# Patient Record
Sex: Male | Born: 1950 | Race: Black or African American | Hispanic: No | Marital: Single | Smoking: Never smoker
Health system: Southern US, Community
[De-identification: ages and names within clinical notes are randomized; demographics above are authoritative.]

## PROBLEM LIST (undated history)

## (undated) DIAGNOSIS — I2510 Atherosclerotic heart disease of native coronary artery without angina pectoris: Secondary | ICD-10-CM

## (undated) DIAGNOSIS — I10 Essential (primary) hypertension: Secondary | ICD-10-CM

## (undated) DIAGNOSIS — G4733 Obstructive sleep apnea (adult) (pediatric): Secondary | ICD-10-CM

## (undated) DIAGNOSIS — E785 Hyperlipidemia, unspecified: Secondary | ICD-10-CM

## (undated) DIAGNOSIS — E669 Obesity, unspecified: Secondary | ICD-10-CM

## (undated) DIAGNOSIS — E1169 Type 2 diabetes mellitus with other specified complication: Secondary | ICD-10-CM

## (undated) DIAGNOSIS — N189 Chronic kidney disease, unspecified: Secondary | ICD-10-CM

## (undated) DIAGNOSIS — I509 Heart failure, unspecified: Secondary | ICD-10-CM

## (undated) HISTORY — DX: Heart failure, unspecified: I50.9

## (undated) HISTORY — DX: Chronic kidney disease, unspecified: N18.9

## (undated) HISTORY — DX: Obesity, unspecified: E66.9

## (undated) HISTORY — DX: Hyperlipidemia, unspecified: E78.5

## (undated) HISTORY — DX: Type 2 diabetes mellitus with other specified complication: E11.69

## (undated) HISTORY — DX: Atherosclerotic heart disease of native coronary artery without angina pectoris: I25.10

## (undated) HISTORY — DX: Morbid (severe) obesity due to excess calories: E66.01

## (undated) HISTORY — DX: Essential (primary) hypertension: I10

## (undated) HISTORY — DX: Obstructive sleep apnea (adult) (pediatric): G47.33

---

## 2006-08-04 DIAGNOSIS — I2510 Atherosclerotic heart disease of native coronary artery without angina pectoris: Secondary | ICD-10-CM

## 2006-08-04 HISTORY — DX: Atherosclerotic heart disease of native coronary artery without angina pectoris: I25.10

## 2007-01-25 ENCOUNTER — Ambulatory Visit: Payer: Self-pay | Admitting: Cardiology

## 2007-01-26 ENCOUNTER — Ambulatory Visit: Payer: Self-pay | Admitting: Cardiology

## 2007-01-26 ENCOUNTER — Inpatient Hospital Stay (HOSPITAL_COMMUNITY): Admission: AD | Admit: 2007-01-26 | Discharge: 2007-01-30 | Payer: Self-pay | Admitting: Internal Medicine

## 2007-02-02 ENCOUNTER — Ambulatory Visit: Payer: Self-pay | Admitting: Cardiology

## 2007-02-02 LAB — CONVERTED CEMR LAB
BUN: 24 mg/dL — ABNORMAL HIGH (ref 6–23)
Calcium: 9.4 mg/dL (ref 8.4–10.5)
GFR calc Af Amer: 51 mL/min
GFR calc non Af Amer: 42 mL/min

## 2007-03-01 ENCOUNTER — Ambulatory Visit: Payer: Self-pay | Admitting: Cardiology

## 2007-03-01 ENCOUNTER — Ambulatory Visit: Payer: Self-pay

## 2007-03-01 LAB — CONVERTED CEMR LAB
BUN: 15 mg/dL (ref 6–23)
Calcium: 9.2 mg/dL (ref 8.4–10.5)
GFR calc Af Amer: 54 mL/min
GFR calc non Af Amer: 45 mL/min
Glucose, Bld: 64 mg/dL — ABNORMAL LOW (ref 70–99)
Potassium: 3.9 meq/L (ref 3.5–5.1)

## 2007-03-04 ENCOUNTER — Ambulatory Visit: Payer: Self-pay | Admitting: Cardiology

## 2007-03-04 ENCOUNTER — Inpatient Hospital Stay (HOSPITAL_COMMUNITY): Admission: AD | Admit: 2007-03-04 | Discharge: 2007-03-08 | Payer: Self-pay | Admitting: Internal Medicine

## 2007-03-09 ENCOUNTER — Ambulatory Visit: Payer: Self-pay | Admitting: Internal Medicine

## 2007-03-09 LAB — CONVERTED CEMR LAB
Calcium: 9.6 mg/dL (ref 8.4–10.5)
Chloride: 102 meq/L (ref 96–112)
GFR calc Af Amer: 54 mL/min
GFR calc non Af Amer: 45 mL/min
Sodium: 139 meq/L (ref 135–145)

## 2007-03-17 ENCOUNTER — Ambulatory Visit: Payer: Self-pay | Admitting: Internal Medicine

## 2007-03-17 LAB — CONVERTED CEMR LAB
BUN: 20 mg/dL (ref 6–23)
Creatinine, Ser: 1.8 mg/dL — ABNORMAL HIGH (ref 0.4–1.5)
GFR calc Af Amer: 51 mL/min
GFR calc non Af Amer: 42 mL/min
Potassium: 3.5 meq/L (ref 3.5–5.1)
Pro B Natriuretic peptide (BNP): 22 pg/mL (ref 0.0–100.0)

## 2007-04-12 ENCOUNTER — Ambulatory Visit: Payer: Self-pay | Admitting: Cardiology

## 2007-05-25 ENCOUNTER — Ambulatory Visit: Payer: Self-pay | Admitting: Internal Medicine

## 2007-05-25 LAB — CONVERTED CEMR LAB
ALT: 10 units/L (ref 0–53)
AST: 15 units/L (ref 0–37)
Alkaline Phosphatase: 73 units/L (ref 39–117)
BUN: 16 mg/dL (ref 6–23)
Bilirubin, Direct: 0.1 mg/dL (ref 0.0–0.3)
CO2: 31 meq/L (ref 19–32)
Calcium: 9.5 mg/dL (ref 8.4–10.5)
Chloride: 104 meq/L (ref 96–112)
Cholesterol: 133 mg/dL (ref 0–200)
Creatinine, Ser: 1.8 mg/dL — ABNORMAL HIGH (ref 0.4–1.5)
Glucose, Bld: 119 mg/dL — ABNORMAL HIGH (ref 70–99)
Total Bilirubin: 0.6 mg/dL (ref 0.3–1.2)
Total CHOL/HDL Ratio: 3.7
Total Protein: 7.5 g/dL (ref 6.0–8.3)

## 2007-06-08 ENCOUNTER — Ambulatory Visit (HOSPITAL_COMMUNITY): Admission: RE | Admit: 2007-06-08 | Discharge: 2007-06-08 | Payer: Self-pay | Admitting: Cardiology

## 2007-06-21 ENCOUNTER — Ambulatory Visit: Payer: Self-pay | Admitting: Cardiology

## 2007-06-22 ENCOUNTER — Ambulatory Visit: Payer: Self-pay | Admitting: Internal Medicine

## 2007-07-22 ENCOUNTER — Ambulatory Visit: Payer: Self-pay | Admitting: Cardiology

## 2007-07-22 LAB — CONVERTED CEMR LAB
ALT: 14 units/L (ref 0–53)
AST: 15 units/L (ref 0–37)
Albumin: 3.8 g/dL (ref 3.5–5.2)
Basophils Absolute: 0.1 10*3/uL (ref 0.0–0.1)
Calcium: 9.7 mg/dL (ref 8.4–10.5)
Chloride: 100 meq/L (ref 96–112)
Creatinine, Ser: 1.9 mg/dL — ABNORMAL HIGH (ref 0.4–1.5)
Eosinophils Absolute: 0.2 10*3/uL (ref 0.0–0.6)
Eosinophils Relative: 2.1 % (ref 0.0–5.0)
GFR calc non Af Amer: 39 mL/min
Glucose, Bld: 91 mg/dL (ref 70–99)
HCT: 35.5 % — ABNORMAL LOW (ref 39.0–52.0)
LDL Cholesterol: 63 mg/dL (ref 0–99)
MCV: 85.3 fL (ref 78.0–100.0)
Neutrophils Relative %: 70.9 % (ref 43.0–77.0)
Platelets: 176 10*3/uL (ref 150–400)
RBC: 4.17 M/uL — ABNORMAL LOW (ref 4.22–5.81)
RDW: 14.6 % (ref 11.5–14.6)
Sodium: 138 meq/L (ref 135–145)
Total Bilirubin: 0.5 mg/dL (ref 0.3–1.2)
Total CHOL/HDL Ratio: 3.2
Triglycerides: 95 mg/dL (ref 0–149)
WBC: 9 10*3/uL (ref 4.5–10.5)

## 2007-08-19 ENCOUNTER — Ambulatory Visit: Payer: Self-pay | Admitting: Internal Medicine

## 2007-09-22 ENCOUNTER — Ambulatory Visit: Payer: Self-pay | Admitting: Internal Medicine

## 2007-09-22 LAB — CONVERTED CEMR LAB
BUN: 18 mg/dL (ref 6–23)
CO2: 32 meq/L (ref 19–32)
Calcium: 9.6 mg/dL (ref 8.4–10.5)
Creatinine, Ser: 1.8 mg/dL — ABNORMAL HIGH (ref 0.4–1.5)
Glucose, Bld: 140 mg/dL — ABNORMAL HIGH (ref 70–99)
Pro B Natriuretic peptide (BNP): 15 pg/mL (ref 0.0–100.0)

## 2007-10-01 ENCOUNTER — Ambulatory Visit: Payer: Self-pay | Admitting: Cardiology

## 2007-10-01 LAB — CONVERTED CEMR LAB
BUN: 17 mg/dL (ref 6–23)
CO2: 33 meq/L — ABNORMAL HIGH (ref 19–32)
Calcium: 9.5 mg/dL (ref 8.4–10.5)
Chloride: 104 meq/L (ref 96–112)
Creatinine, Ser: 1.4 mg/dL (ref 0.4–1.5)
Potassium: 3.8 meq/L (ref 3.5–5.1)

## 2007-10-19 ENCOUNTER — Ambulatory Visit: Payer: Self-pay | Admitting: Cardiology

## 2007-11-01 ENCOUNTER — Encounter: Payer: Self-pay | Admitting: Cardiology

## 2007-11-01 ENCOUNTER — Ambulatory Visit: Payer: Self-pay

## 2007-11-03 ENCOUNTER — Ambulatory Visit: Payer: Self-pay | Admitting: Internal Medicine

## 2008-02-02 ENCOUNTER — Ambulatory Visit: Payer: Self-pay | Admitting: Internal Medicine

## 2008-02-02 LAB — CONVERTED CEMR LAB
BUN: 19 mg/dL (ref 6–23)
CO2: 30 meq/L (ref 19–32)
Chloride: 107 meq/L (ref 96–112)
GFR calc Af Amer: 54 mL/min
Glucose, Bld: 144 mg/dL — ABNORMAL HIGH (ref 70–99)
Potassium: 3.6 meq/L (ref 3.5–5.1)
Sodium: 143 meq/L (ref 135–145)

## 2008-03-13 ENCOUNTER — Ambulatory Visit: Payer: Self-pay | Admitting: Cardiology

## 2008-03-13 LAB — CONVERTED CEMR LAB
Chloride: 106 meq/L (ref 96–112)
GFR calc Af Amer: 51 mL/min
Glucose, Bld: 129 mg/dL — ABNORMAL HIGH (ref 70–99)
Potassium: 4 meq/L (ref 3.5–5.1)
Sodium: 142 meq/L (ref 135–145)

## 2008-03-27 ENCOUNTER — Ambulatory Visit: Payer: Self-pay | Admitting: Internal Medicine

## 2008-04-20 ENCOUNTER — Ambulatory Visit: Payer: Self-pay | Admitting: Internal Medicine

## 2008-04-26 ENCOUNTER — Ambulatory Visit: Payer: Self-pay | Admitting: Internal Medicine

## 2008-04-26 ENCOUNTER — Ambulatory Visit: Payer: Self-pay | Admitting: Cardiology

## 2008-04-26 LAB — CONVERTED CEMR LAB
Chloride: 106 meq/L (ref 96–112)
GFR calc Af Amer: 51 mL/min
GFR calc non Af Amer: 42 mL/min
Glucose, Bld: 154 mg/dL — ABNORMAL HIGH (ref 70–99)
Potassium: 4.2 meq/L (ref 3.5–5.1)
Pro B Natriuretic peptide (BNP): 19 pg/mL (ref 0.0–100.0)
Sodium: 140 meq/L (ref 135–145)

## 2008-06-20 ENCOUNTER — Ambulatory Visit: Payer: Self-pay | Admitting: Internal Medicine

## 2008-06-20 LAB — CONVERTED CEMR LAB
CO2: 31 meq/L (ref 19–32)
Calcium: 9.3 mg/dL (ref 8.4–10.5)
GFR calc Af Amer: 54 mL/min
Potassium: 3.7 meq/L (ref 3.5–5.1)
Pro B Natriuretic peptide (BNP): 26 pg/mL (ref 0.0–100.0)
Sodium: 139 meq/L (ref 135–145)

## 2008-08-28 ENCOUNTER — Ambulatory Visit: Payer: Self-pay | Admitting: Internal Medicine

## 2008-08-28 LAB — CONVERTED CEMR LAB
AST: 17 units/L (ref 0–37)
Albumin: 3.7 g/dL (ref 3.5–5.2)
Alkaline Phosphatase: 63 units/L (ref 39–117)
BUN: 35 mg/dL — ABNORMAL HIGH (ref 6–23)
CO2: 34 meq/L — ABNORMAL HIGH (ref 19–32)
Chloride: 101 meq/L (ref 96–112)
Eosinophils Relative: 1.5 % (ref 0.0–5.0)
Glucose, Bld: 185 mg/dL — ABNORMAL HIGH (ref 70–99)
Monocytes Relative: 6.3 % (ref 3.0–12.0)
Neutrophils Relative %: 71.8 % (ref 43.0–77.0)
Platelets: 182 10*3/uL (ref 150–400)
Potassium: 2.9 meq/L — ABNORMAL LOW (ref 3.5–5.1)
Total Protein: 7.7 g/dL (ref 6.0–8.3)
WBC: 11.2 10*3/uL — ABNORMAL HIGH (ref 4.5–10.5)

## 2008-08-30 ENCOUNTER — Ambulatory Visit: Payer: Self-pay | Admitting: Internal Medicine

## 2008-08-30 ENCOUNTER — Ambulatory Visit: Payer: Self-pay

## 2008-08-30 LAB — CONVERTED CEMR LAB
BUN: 37 mg/dL — ABNORMAL HIGH (ref 6–23)
Chloride: 100 meq/L (ref 96–112)
Creatinine, Ser: 2.1 mg/dL — ABNORMAL HIGH (ref 0.4–1.5)
GFR calc non Af Amer: 35 mL/min

## 2008-09-04 ENCOUNTER — Ambulatory Visit: Payer: Self-pay | Admitting: Internal Medicine

## 2008-09-04 LAB — CONVERTED CEMR LAB
CO2: 33 meq/L — ABNORMAL HIGH (ref 19–32)
Calcium: 9.8 mg/dL (ref 8.4–10.5)
Creatinine, Ser: 1.9 mg/dL — ABNORMAL HIGH (ref 0.4–1.5)

## 2008-11-22 IMAGING — CT CT ANGIO ABDOMEN
2 of 8 series · 15 of 46 positions shown, 17 images · IV contrast (omnipaque)
Comparison: none

CLINICAL DATA: 56-year old male hypertensive crisis, uncontrolled hypertension.
CT ANGIOGRAPHY OF ABDOMEN:
TECHNIQUE: Multidetector CT imaging of the abdomen was performed during bolus injection of intravenous contrast.  Multiplanar CT angiographic image reconstructions were generated to evaluate the vascular anatomy.
Contrast:  100 cc Omnipaque 350.

[Series 5: abd cta 2.0 (id) · axial · 0.88mm/px · z∈[-300,-16]mm · 12 of 168 slices shown, 14 images]
[im 13/168  soft-tissue]
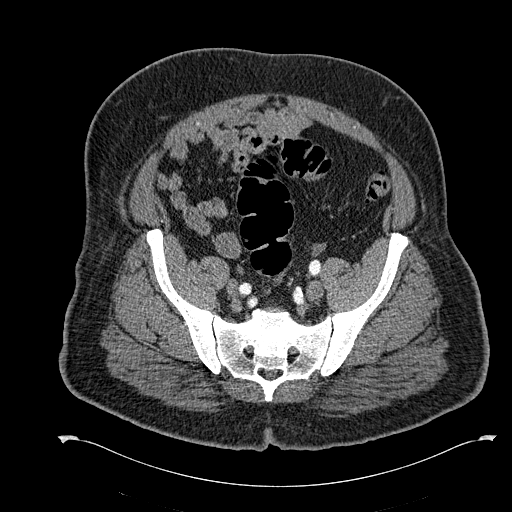
[im 13/168  bone]
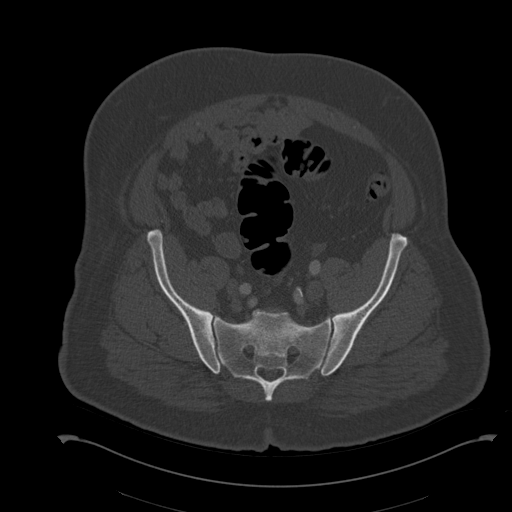
[im 26/168  soft-tissue]
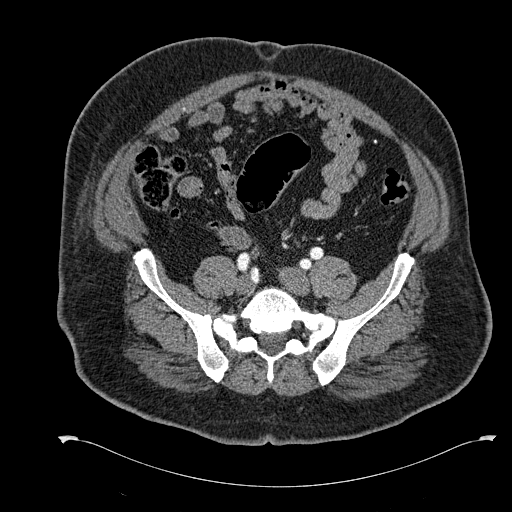
[im 39/168  soft-tissue]
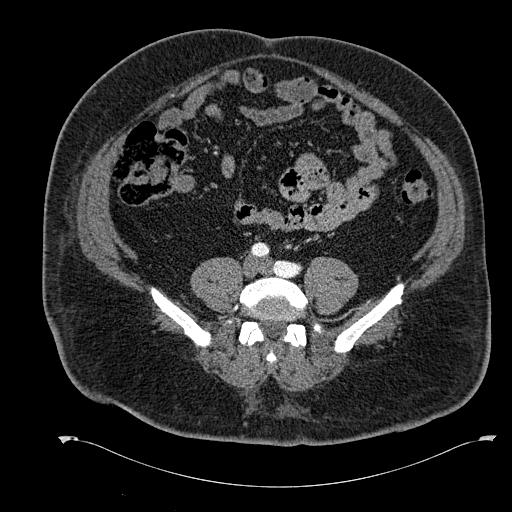
[im 52/168  soft-tissue]
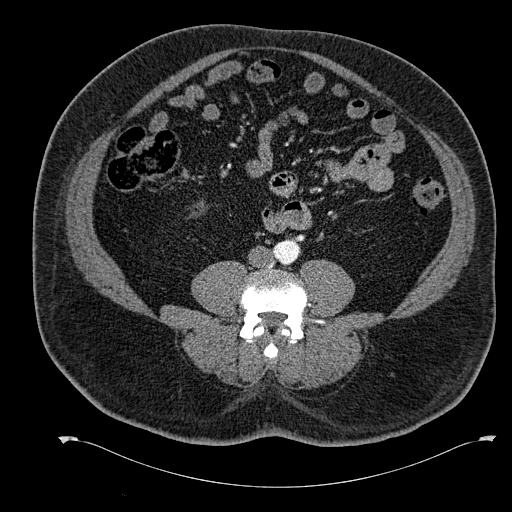
[im 65/168  soft-tissue]
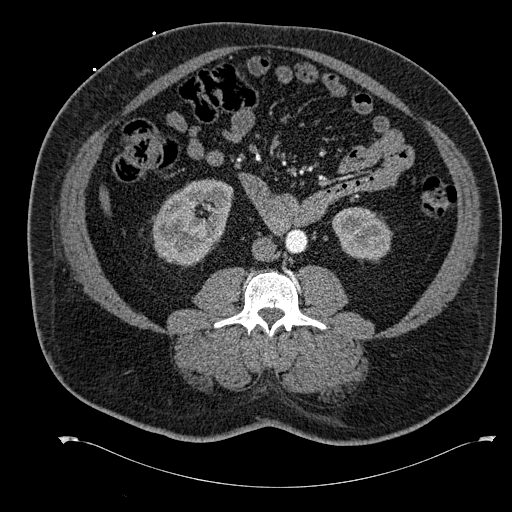
[im 78/168  soft-tissue]
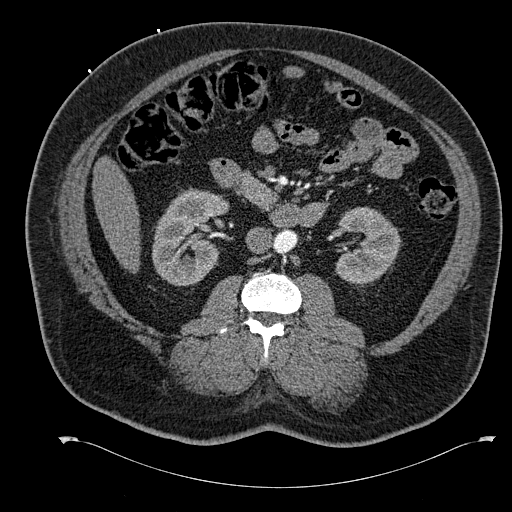
[im 90/168  soft-tissue]
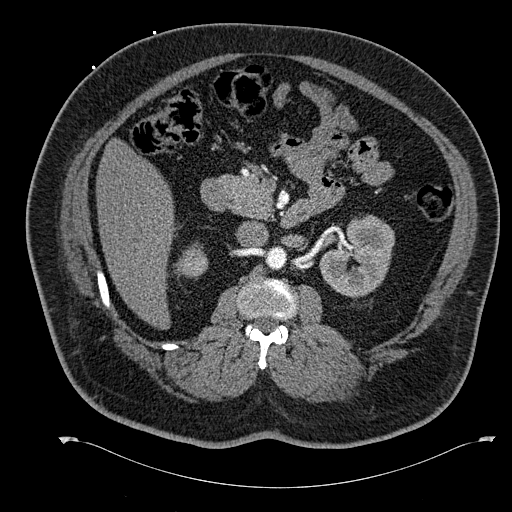
[im 103/168  soft-tissue]
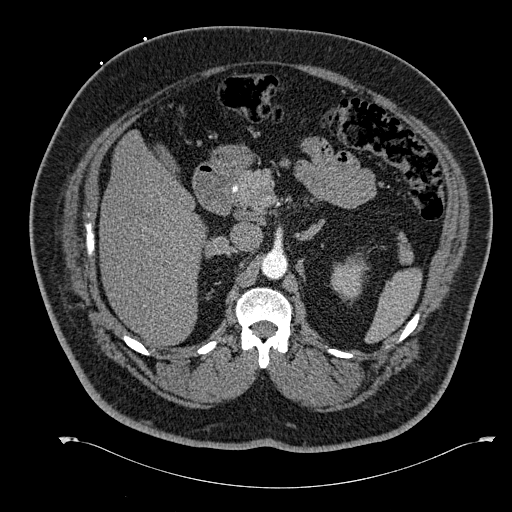
[im 116/168  soft-tissue]
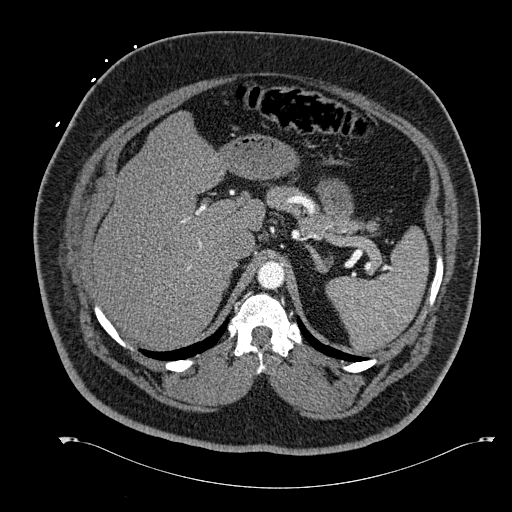
[im 116/168  bone]
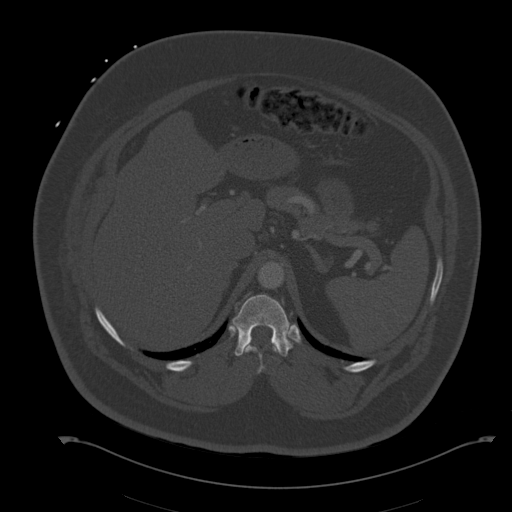
[im 129/168  soft-tissue]
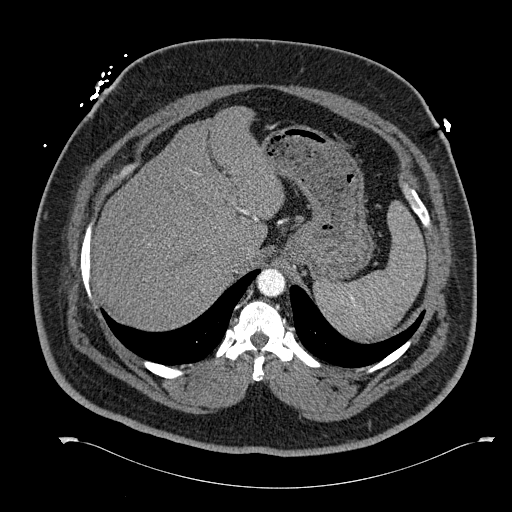
[im 142/168  soft-tissue]
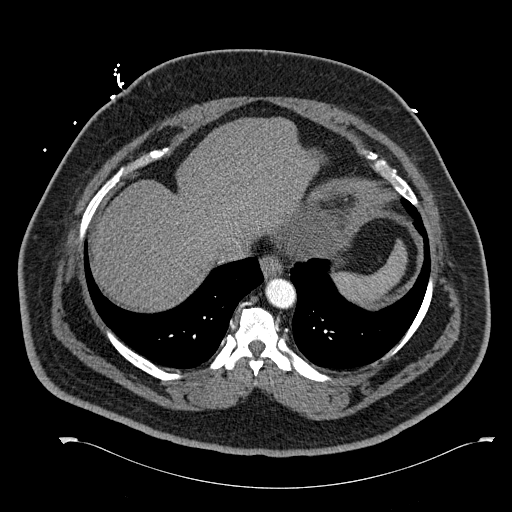
[im 155/168  soft-tissue]
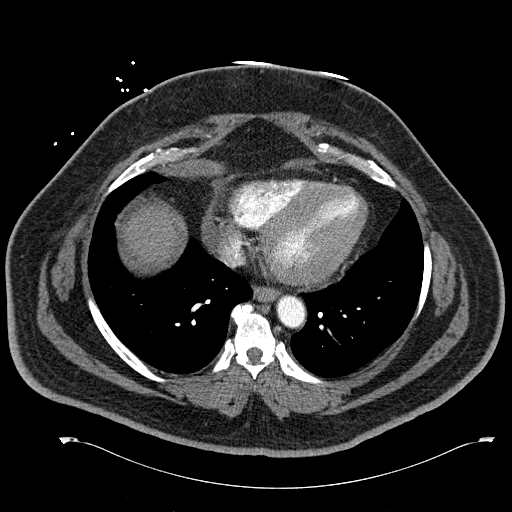

[Series 602: cor · coronal · 0.74mm/px · 3 of 168 slices shown]
[im 56/168  soft-tissue]
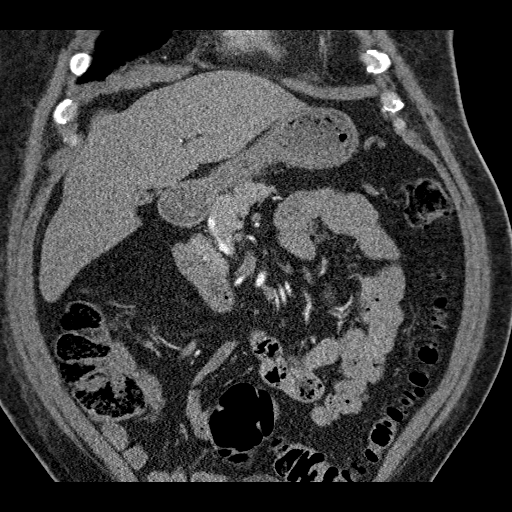
[im 75/168  soft-tissue]
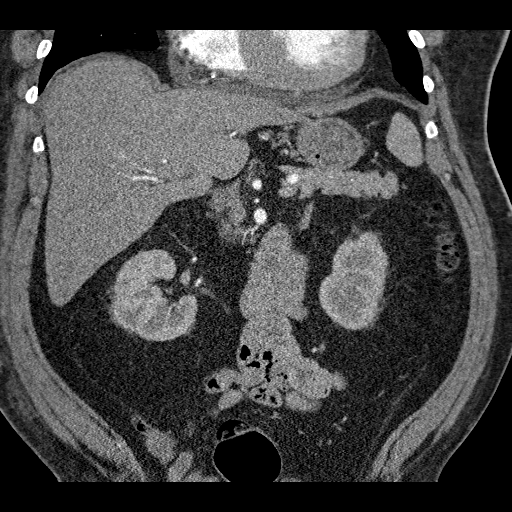
[im 93/168  soft-tissue]
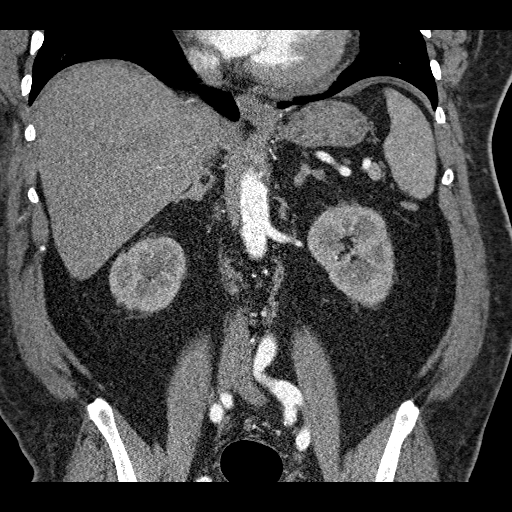

[15 of 46 positions shown; findings below may reference images not displayed]

FINDINGS: Minimal atherosclerosis and calcification of the abdominal aorta without aneurysm, dissection or occlusive disease.  The celiac, superior mesenteric, single renal arteries and inferior mesenteric arteries are all patent.  No evidence of proximal or ostial renal artery narrowing or stenosis.  No accessory renal vasculature.  Of note, the left hepatic artery originates off the left gastric artery and the right hepatic artery originates off the SMA proximally, these are normal variants of the abdominal vascular anatomy.  There is a small dependent pericardial effusion.  Kidneys are symmetric in size and shape.  No evidence of cortical atrophy or thinning.  No hydronephrosis or acute obstructive uropathy.
Additional imaging of the abdomen demonstrates no acute inflammation, ascites, fluid collection, abscess or obstruction.  The liver, collapsed gallbladder, biliary system, spleen, pancreas and adrenal glands are within normal limits.  Exam of the bowel is limited without oral contrast for CTA protocol.  No visualized bowel wall thickening.  Terminal ileum and appendix are normal in the right lower quadrant.  Degenerative changes are present in the thoracic spine.
IMPRESSION: 1.  Patent single renal arteries.  No evidence of proximal or ostial renal vascular disease. 
2.  Mild aortic atherosclerosis without aneurysm or occlusive disease. 
3.  Patent visceral arteries as described.

## 2008-12-15 ENCOUNTER — Telehealth (INDEPENDENT_AMBULATORY_CARE_PROVIDER_SITE_OTHER): Payer: Self-pay | Admitting: Nurse Practitioner

## 2008-12-26 ENCOUNTER — Ambulatory Visit: Payer: Self-pay | Admitting: Internal Medicine

## 2008-12-26 ENCOUNTER — Encounter (INDEPENDENT_AMBULATORY_CARE_PROVIDER_SITE_OTHER): Payer: Self-pay | Admitting: Nurse Practitioner

## 2008-12-26 DIAGNOSIS — I509 Heart failure, unspecified: Secondary | ICD-10-CM | POA: Insufficient documentation

## 2008-12-26 DIAGNOSIS — I2510 Atherosclerotic heart disease of native coronary artery without angina pectoris: Secondary | ICD-10-CM | POA: Insufficient documentation

## 2008-12-26 DIAGNOSIS — E119 Type 2 diabetes mellitus without complications: Secondary | ICD-10-CM

## 2008-12-26 DIAGNOSIS — M109 Gout, unspecified: Secondary | ICD-10-CM

## 2008-12-26 DIAGNOSIS — I10 Essential (primary) hypertension: Secondary | ICD-10-CM | POA: Insufficient documentation

## 2008-12-26 DIAGNOSIS — N259 Disorder resulting from impaired renal tubular function, unspecified: Secondary | ICD-10-CM | POA: Insufficient documentation

## 2008-12-26 DIAGNOSIS — E785 Hyperlipidemia, unspecified: Secondary | ICD-10-CM | POA: Insufficient documentation

## 2008-12-28 ENCOUNTER — Telehealth (INDEPENDENT_AMBULATORY_CARE_PROVIDER_SITE_OTHER): Payer: Self-pay | Admitting: Nurse Practitioner

## 2008-12-28 ENCOUNTER — Encounter (INDEPENDENT_AMBULATORY_CARE_PROVIDER_SITE_OTHER): Payer: Self-pay | Admitting: Nurse Practitioner

## 2008-12-28 LAB — CONVERTED CEMR LAB
ALT: 13 units/L (ref 0–53)
Albumin: 3.7 g/dL (ref 3.5–5.2)
BUN: 27 mg/dL — ABNORMAL HIGH (ref 6–23)
CO2: 33 meq/L — ABNORMAL HIGH (ref 19–32)
Calcium: 9.4 mg/dL (ref 8.4–10.5)
Cholesterol: 196 mg/dL (ref 0–200)
Creatinine, Ser: 1.9 mg/dL — ABNORMAL HIGH (ref 0.4–1.5)
Glucose, Bld: 166 mg/dL — ABNORMAL HIGH (ref 70–99)
Pro B Natriuretic peptide (BNP): 27 pg/mL (ref 0.0–100.0)
Total Bilirubin: 0.4 mg/dL (ref 0.3–1.2)
Total Protein: 7.3 g/dL (ref 6.0–8.3)
Triglycerides: 135 mg/dL (ref 0.0–149.0)

## 2009-01-02 ENCOUNTER — Ambulatory Visit: Payer: Self-pay | Admitting: Internal Medicine

## 2009-01-02 ENCOUNTER — Telehealth (INDEPENDENT_AMBULATORY_CARE_PROVIDER_SITE_OTHER): Payer: Self-pay | Admitting: Nurse Practitioner

## 2009-01-02 DIAGNOSIS — I5023 Acute on chronic systolic (congestive) heart failure: Secondary | ICD-10-CM | POA: Insufficient documentation

## 2009-01-02 LAB — CONVERTED CEMR LAB
CO2: 35 meq/L — ABNORMAL HIGH (ref 19–32)
Chloride: 104 meq/L (ref 96–112)
Glucose, Bld: 119 mg/dL — ABNORMAL HIGH (ref 70–99)
Potassium: 3.4 meq/L — ABNORMAL LOW (ref 3.5–5.1)
Sodium: 143 meq/L (ref 135–145)

## 2009-01-05 ENCOUNTER — Telehealth (INDEPENDENT_AMBULATORY_CARE_PROVIDER_SITE_OTHER): Payer: Self-pay | Admitting: Nurse Practitioner

## 2009-01-10 ENCOUNTER — Encounter: Payer: Self-pay | Admitting: Internal Medicine

## 2009-03-15 ENCOUNTER — Encounter: Payer: Self-pay | Admitting: Internal Medicine

## 2009-03-15 ENCOUNTER — Ambulatory Visit: Payer: Self-pay | Admitting: Internal Medicine

## 2009-03-20 ENCOUNTER — Telehealth (INDEPENDENT_AMBULATORY_CARE_PROVIDER_SITE_OTHER): Payer: Self-pay | Admitting: *Deleted

## 2009-03-21 ENCOUNTER — Ambulatory Visit: Payer: Self-pay

## 2009-03-21 ENCOUNTER — Ambulatory Visit: Payer: Self-pay | Admitting: Internal Medicine

## 2009-03-22 LAB — CONVERTED CEMR LAB
Calcium: 9.5 mg/dL (ref 8.4–10.5)
Creatinine, Ser: 2.1 mg/dL — ABNORMAL HIGH (ref 0.4–1.5)
GFR calc non Af Amer: 42.16 mL/min (ref >60)
HDL: 41.2 mg/dL (ref >39.00)
Sodium: 143 meq/L (ref 135–145)
Total CHOL/HDL Ratio: 3
Triglycerides: 88 mg/dL (ref 0.0–149.0)
VLDL: 17.6 mg/dL (ref 0.0–40.0)

## 2009-03-26 ENCOUNTER — Ambulatory Visit: Payer: Self-pay | Admitting: Internal Medicine

## 2009-03-26 DIAGNOSIS — I5022 Chronic systolic (congestive) heart failure: Secondary | ICD-10-CM

## 2009-03-30 LAB — CONVERTED CEMR LAB
Chloride: 103 meq/L (ref 96–112)
Creatinine, Ser: 1.6 mg/dL — ABNORMAL HIGH (ref 0.4–1.5)
GFR calc non Af Amer: 57.7 mL/min (ref >60)
Potassium: 4.3 meq/L (ref 3.5–5.1)
Pro B Natriuretic peptide (BNP): 32 pg/mL (ref 0.0–100.0)

## 2009-05-07 ENCOUNTER — Encounter (INDEPENDENT_AMBULATORY_CARE_PROVIDER_SITE_OTHER): Payer: Self-pay | Admitting: *Deleted

## 2009-05-15 ENCOUNTER — Encounter: Payer: Self-pay | Admitting: Internal Medicine

## 2009-05-17 ENCOUNTER — Encounter: Payer: Self-pay | Admitting: Internal Medicine

## 2009-05-31 ENCOUNTER — Ambulatory Visit: Payer: Self-pay | Admitting: Internal Medicine

## 2009-05-31 DIAGNOSIS — M79609 Pain in unspecified limb: Secondary | ICD-10-CM

## 2009-05-31 LAB — CONVERTED CEMR LAB: Cholesterol, target level: 200 mg/dL

## 2009-06-01 ENCOUNTER — Telehealth (INDEPENDENT_AMBULATORY_CARE_PROVIDER_SITE_OTHER): Payer: Self-pay | Admitting: *Deleted

## 2009-06-13 ENCOUNTER — Encounter: Payer: Self-pay | Admitting: Internal Medicine

## 2009-06-13 ENCOUNTER — Ambulatory Visit: Payer: Self-pay

## 2009-06-20 ENCOUNTER — Encounter: Payer: Self-pay | Admitting: Internal Medicine

## 2009-11-26 ENCOUNTER — Ambulatory Visit: Payer: Self-pay | Admitting: Internal Medicine

## 2009-11-26 DIAGNOSIS — I5032 Chronic diastolic (congestive) heart failure: Secondary | ICD-10-CM | POA: Insufficient documentation

## 2010-06-12 ENCOUNTER — Ambulatory Visit: Payer: Self-pay | Admitting: Internal Medicine

## 2010-06-13 LAB — CONVERTED CEMR LAB
BUN: 26 mg/dL — ABNORMAL HIGH (ref 6–23)
CO2: 26 meq/L (ref 19–32)
Calcium: 9.9 mg/dL (ref 8.4–10.5)
Chloride: 102 meq/L (ref 96–112)
Creatinine, Ser: 2.2 mg/dL — ABNORMAL HIGH (ref 0.4–1.5)
Pro B Natriuretic peptide (BNP): 12.4 pg/mL (ref 0.0–100.0)

## 2010-06-26 ENCOUNTER — Ambulatory Visit: Payer: Self-pay | Admitting: Internal Medicine

## 2010-09-04 NOTE — Assessment & Plan Note (Signed)
Summary: f60m   Visit Type:  Follow-up Referring Faris Coolman:  Nicholes Mango Primary Levonte Molina:  Dr Bertram Denver  CC:  no complaints.  History of Present Illness:  Mr. [name] is a 60 year old male with a history of  diastolic heart failure with ejection fraction approximately 50%.  He  has nonobstructive coronary artery disease by catheterization in 2008. Myoview 8/10 47% perfusion normal.  He also has history of severe hypertension, obesity, diabetes, hyperlipidemia,  gout, chronic renal insufficiency with the baseline creatinine of 1.7-  1.8 and obstructive sleep apnea with only partial compliance to CPAP.   Returns today for routine f/u.   BP much improved. Checks 2x week at home. Systolics typically 115-120. No edema. No CP. Breathing doing well. Main limitation to activity seems to be gout in feet. Gained 2 pounds. Wears CPAP about 4 hours per night.  No palpitations. No orthopnea/pnd. No problems with medicines.    Current Medications (verified): 1)  Aspirin 81 Mg  Tabs (Aspirin) .... Daily 2)  Simvastatin 80 Mg Tabs (Simvastatin) .Marland Kitchen.. 1 By Mouth Daily 3)  Klor-Con M20 20 Meq Cr-Tabs (Potassium Chloride Crys Cr) .... Take One Tablet By Mouth Twice Daily. 4)  Glipizide 10 Mg Tabs (Glipizide) .... Take One Tablet By Mouth Once Daily. 5)  Spironolactone 25 Mg Tabs (Spironolactone) .... Take One Tablet By Mouth Daily 6)  Carvedilol 25 Mg Tabs (Carvedilol) .... Take One Tablet By Mouth Twice A Day 7)  Januvia 100 Mg Tabs (Sitagliptin Phosphate) .Marland Kitchen.. 1 By Mouth Daily 8)  Bumetanide 1 Mg Tabs (Bumetanide) .... Once Daily 9)  Allopurinol 100 Mg Tabs (Allopurinol) .... Two Times A Day 10)  Novolin N 100 Unit/ml Susp (Insulin Isophane Human) .... As Directed 11)  Indomethacin 50 Mg Caps (Indomethacin) .... As Needed 12)  Hydrocodone-Acetaminophen 5-500 Mg Tabs (Hydrocodone-Acetaminophen) .... As Needed For Gout  Allergies (verified): No Known Drug Allergies  Review of Systems       As per HPI  and past medical history; otherwise all systems negative.   Vital Signs:  Patient profile:   60 year old male Height:      66 inches Weight:      268 pounds BMI:     43.41 Pulse rate:   94 / minute BP sitting:   110 / 72  (left arm) Cuff size:   regular  Vitals Entered By: Hardin Negus, RMA (June 12, 2010 11:20 AM)  Physical Exam  General:  Obese well appearing. no resp difficulty HEENT: normal Neck: supple. JVP hard to see. Carotids 2+ bilat; no bruits. No lymphadenopathy or thryomegaly appreciated. Cor: PMI nondisplaced. Regular rate & rhythm. No rubs, murmur. +s4 Lungs: clear Abdomen: obese soft, nontender, nondistended.  Good bowel sounds. Extremities: no cyanosis, clubbing, rash, 1+ edema Neuro: alert & orientedx3, cranial nerves grossly intact. moves all 4 extremities w/o difficulty. affect pleasant    Impression & Recommendations:  Problem # 1:  DIASTOLIC HEART FAILURE, CHRONIC (ICD-428.32) Doing well. Volume status perhaps mildly elevated (hard to tell given body habitus). Will increase Bumex to two times a day on Mon and Fri. Check labs.  Problem # 2:  HYPERTENSION (ICD-401.9) Blood pressure well controlled. Continue current regimen.  Problem # 3:  DYSLIPIDEMIA (ICD-272.4) On simva 80 and amlodipine. Given black box warning will change simva to lipitor 80.   Problem # 4:  OBESITY Counseled on need for weight loss.   Other Orders: EKG w/ Interpretation (93000) TLB-BNP (B-Natriuretic Peptide) (83880-BNPR) TLB-BMP (Basic Metabolic Panel-BMET) (  80048-METABOL)  Patient Instructions: 1)  Your physician wants you to follow-up in:6 months   You will receive a reminder letter in the mail two months in advance. If you don't receive a letter, please call our office to schedule the follow-up appointment. 2)  Your physician recommends that you return for lab work NW:[id number] 3)  Your physician has recommended you make the following change in your medication: Change  from Simvastatin to Lipitor 80mg , Increase Bumex to two times a day on Mondays and Fridays  Prescriptions: LIPITOR 80 MG TABS (ATORVASTATIN CALCIUM) Take one tablet by mouth at bedtime  #30 x 6   Entered by:   Dessie Coma  LPN   Authorized by:   Dolores Patty, MD, [facility]   Signed by:   Dessie Coma  LPN on 13/03/6577   Method used:   Electronically to        [facility] (retail)       [address]       [place], [place]  [address]       Ph: [phone]       Fax: [phone]   RxID:   [id number] BUMETANIDE 1 MG TABS (BUMETANIDE) once daily except two times a day on Mondays and Fridays  #40 x 6   Entered by:   Dessie Coma  LPN   Authorized by:   Dolores Patty, MD, [facility]   Signed by:   Dessie Coma  LPN on 64/33/2951   Method used:   Electronically to        [facility] (retail)       [address]       [place], [place]  [address]       Ph: [phone]       Fax: [phone]   RxID:   [id number]   Prevention & Chronic Care Immunizations   Influenza vaccine: Not documented    Tetanus booster: Not documented    Pneumococcal vaccine: Not documented  Colorectal Screening   Hemoccult: Not documented    Colonoscopy: Not documented  Other Screening   PSA: Not documented   Smoking status: never  (12/26/2008)  Diabetes Mellitus   HgbA1C: 8.9  (12/26/2008)    Eye exam: Not documented    Foot exam: Not documented   High risk foot: Not documented   Foot care education: Not documented    Urine microalbumin/creatinine ratio: Not documented  Lipids   Total Cholesterol: 119  (03/21/2009)   LDL: 60  (03/21/2009)   LDL Direct: Not documented   HDL: 41.20  (03/21/2009)   Triglycerides: 88.0  (03/21/2009)    SGOT (AST): 22  (12/26/2008)   SGPT (ALT): 13  (12/26/2008)   Alkaline phosphatase: 67  (12/26/2008)   Total bilirubin: 0.4  (12/26/2008)  Hypertension   Last Blood  Pressure: 110 / 72  (06/12/2010)   Serum creatinine: 1.6  (03/26/2009)   Serum potassium 4.3  (03/26/2009)  Self-Management Support :    Diabetes self-management support: Not documented    Hypertension self-management support: Not documented    Lipid self-management support: Not documented

## 2010-09-04 NOTE — Assessment & Plan Note (Signed)
Summary: 6 month   Visit Type:  Follow-up Referring Provider:  Nicholes Mango Primary Provider:  Dr Bertram Denver  CC:  No complaints.  History of Present Illness:  [name] is a 60 year old [name] with a history of  diastolic heart failure with ejection fraction approximately 50%.  He  has nonobstructive coronary artery disease by catheterization in 2008. Myoview 8/10 47% perfusion normal.  He also has history of severe hypertension, obesity, diabetes, hyperlipidemia,  gout, chronic renal insufficiency with the baseline creatinine of 1.7-  1.8 and obstructive sleep apnea with only partial compliance to CPAP.   Returns today for routine f/u.   BP much improved 130/80s. No edema. No CP. Breathing doing well. Can walk a godd distance until feet start hurting. Wears CPAP about 4 hours per night.  No palpitations. No orthopnea/pnd. No problems with medicines.   Current Medications (verified): 1)  Chlorthalidone 25 Mg Tabs (Chlorthalidone) .... Take One Tablet By Mouth Daily 2)  Amlodipine Besylate 10 Mg Tabs (Amlodipine Besylate) .... Take One Tablet By Mouth Daily 3)  Aspirin 81 Mg  Tabs (Aspirin) .... Daily 4)  Simvastatin 80 Mg Tabs (Simvastatin) .Marland Kitchen.. 1 By Mouth Daily 5)  Klor-Con M20 20 Meq Cr-Tabs (Potassium Chloride Crys Cr) .... Take One Tablet By Mouth Twice Daily. 6)  Glipizide 10 Mg Tabs (Glipizide) .... Take One Tablet By Mouth Once Daily. 7)  Spironolactone 25 Mg Tabs (Spironolactone) .... Take One Tablet By Mouth Daily 8)  Carvedilol 25 Mg Tabs (Carvedilol) .... Take One Tablet By Mouth Twice A Day 9)  Furosemide 40 Mg Tabs (Furosemide) .Marland Kitchen.. 1 By Mouth Daily 10)  Januvia 100 Mg Tabs (Sitagliptin Phosphate) .Marland Kitchen.. 1 By Mouth Daily  Allergies (verified): No Known Drug Allergies  Past History:  Past Medical History: Last updated: 05/31/2009  1. Congestive heart failure secondary to diastolic dysfunction with an       EF 50%.   2. 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   --Adenosine/Myoview 8/10: EF 47%  normal perfusion  3. Hypertension.   4. Morbid obesity.   5. Obstructive sleep apnea with partial compliance to CPAP.   6. Type 2 diabetes.   7. Dyslipidemia.   8. Gout.   9. Chronic renal sufficiency with creatinine 1.7-1.8.      Review of Systems       As per HPI and past medical history; otherwise all systems negative.   Vital Signs:  Patient profile:   60 year old [name] Height:      66 inches Weight:      266 pounds BMI:     43.09 Pulse rate:   90 / minute Resp:     16 per minute BP sitting:   94 / 66  (left arm)  Vitals Entered By: Marrion Coy, CNA (November 26, 2009 10:29 AM)  Physical Exam  General:  Gen: well appearing. no resp difficulty HEENT: normal Neck: supple. no JVD. Carotids 2+ bilat; no bruits. No lymphadenopathy or thryomegaly appreciated. Cor: PMI nondisplaced. Regular rate & rhythm. No rubs, murmur. +s4 Lungs: clear Abdomen: soft, nontender, nondistended. No hepatosplenomegaly. No bruits or masses. Good bowel sounds. Extremities: no cyanosis, clubbing, rash, edema Neuro: alert & orientedx3, cranial nerves grossly intact. moves all 4 extremities w/o difficulty. affect pleasant    Impression & Recommendations:  Problem # 1:  HYPERTENSION (ICD-401.9) BP on low side (checked 3 times manually) but asymptomatic. Continue current regimen.  Problem # 2:  DIASTOLIC HEART FAILURE, CHRONIC (ICD-428.32) Volume status  looks good. Continue current regimen.   Problem # 3:  MORBID OBESITY (ICD-278.01) Counseled on need to lose weight.   Other Orders: EKG w/ Interpretation (93000)   Patient Instructions: 1)  Your physician wants you to follow-up in: 6 months.  You will receive a reminder letter in the mail two months in advance. If you don't receive a letter, please call our office to schedule the follow-up appointment.

## 2010-12-17 NOTE — Assessment & Plan Note (Signed)
[facility]                          CHRONIC HEART FAILURE NOTE   NAME:[name], [name]                        MRN:          [id number]  DATE:10/19/2007                            DOB:          09/11/1950    PRIMARY CARDIOLOGIST:  Dr. Nicholes Mango.   PRIMARY CARE:  Dr. Olena Leatherwood   HISTORY OF PRESENT ILLNESS:  [name] returns today for follow-up of  his congestive heart failure which is secondary to diastolic dysfunction  in the setting of previously poorly controlled hypertension.  [name]  has been very sensitive to adjustments in his blood pressure medications  experiencing extreme lightheadedness and dizziness which resulted in  noncompliance secondary to side effects.  I have been slowly adjusting  his medicines over the last few months to obtain optimal control of his  blood pressure without him experiencing extreme side effects.  He has  had previous extensive workup of his high blood pressure including 24-  hour blood pressure monitor renal ultrasound and renal CT.  He is status  post cardiac catheterization showing nonobstructive disease.  He is  experiencing some mild side effects from his medicine.  He usually has  about 3 hours of fatigue after taking his a.m. clonidine and also  experiences some gout discomfort in his lower extremities.  Today, he is  also complaining of some swelling in his knees and ankles after he  ambulates and feelings of extreme fatigue in his legs with walking.  [name], however, is morbidly obese and very extremely deconditioned  secondary to multiple health problems and his obesity.  He becomes very  dyspneic with minimal exertion.  He usually is unable to take his  medications before he comes to see me here in the morning because the  medicine makes him feel so bad.  Today, he actually took his  medicines.  States he is feeling some mild dizziness, but is tolerable  at this time.  Of course, he never drives  when he takes his medicine.  He always has a family member bring him.  His sister accompanies him  today.   PAST MEDICAL HISTORY:  1. Heart failure secondary diastolic dysfunction with EF currently 50%      by catheterization in June 2008.  2. Poorly controlled hypertension.  Previous workup as stated above.  3. Morbid obesity.  4. Atypical chest pain status post cardiac catheterization showing      nonobstructive disease in 2008.  5. Chronic renal insufficiency with a creatinine 1.7-1.8.  6. Type 2 diabetes.  7. Obstructive sleep apnea with improved compliance with CPAP.  The      patient states he is wearing it all night now.  8. Dyslipidemia.   REVIEW OF SYSTEMS:  As stated above, otherwise negative.   CURRENT MEDICATIONS:  1. Atacand 32 mg daily.  2. Allopurinol 100 b.i.d.  3. Aspirin 81.  4. Lasix.  The patient states he is back to the 40 mg in the morning      and 80 in the evening.  5. Spironolactone 12.5 mg b.i.d.  6.  Clonidine 0.3 mg.  The patient takes 1/2 tablet twice a day.  7. Caduet 10/40 mg daily.  8. Carvedilol 25 mg b.i.d.  9. Glyburide.  10.Metformin 5/500 b.i.d.  11.P.r.n. medications include:      a.     O2.      b.     CPAP q.h.s.   PHYSICAL EXAMINATION:  VITAL SIGNS:  Weight 269, weight is up 4 pounds  from February.  Blood pressure 130/86 with a heart rate of 93.  GENERAL:  [name] is in no acute distress.  He has a JVD maybe around  7 cm.  LUNGS:  Poor expiratory effort without rales, rhonchi or wheezing.  CARDIOVASCULAR:  Reveals S1-S2, positive S4.  ABDOMEN:  Obese, soft, nontender, positive bowel sounds.  LOWER EXTREMITIES:  Without clubbing or cyanosis.  He has a trace edema  that is nonpitting.  NEUROLOGICAL:  Alert and oriented x3.   IMPRESSION:  Heart failure secondary diastolic dysfunction with mild  fluid retention.  I am going to continue his Lasix at 40 in the morning  and 80 in the evening.  As the patient states he feels more  dizzy if he  takes 80 in the morning and 80 in the evening.  I am going to instead  use Atacand 32 mg with 12.5 mg HCTZ in lieu of upping his Lasix.  As [name] relies on samples for his Atacand and there are no 32 mg samples  available without the HCTZ in them, we will try this for the next few  weeks and then hopefully have some samples of the 32 mg Atacand  available.  Continue his other medications.  I am going to repeat an  ultrasound on [name].  We will set him up for a echocardiogram as he  is experiencing increasing dyspnea with minimal exertion, otherwise,  suspect this is more likely secondary to extreme deconditioning.      [name], ACNP  Electronically Signed      Rollene Rotunda, MD, [facility]  Electronically Signed   MB/MedQ  DD: 10/19/2007  DT: 10/19/2007  Job #: [id number]

## 2010-12-17 NOTE — Assessment & Plan Note (Signed)
[facility]                          CHRONIC HEART FAILURE NOTE   NAME:Welp, [name]                        MRN:          [id number]  DATE:03/04/2007                            DOB:          1950-11-28    ADDENDUM:  He is going to be admitted to the hospital.     [name], ACNP  Electronically Signed    MB/MedQ  DD: 03/04/2007  DT: 03/04/2007  Job #: [id number]

## 2010-12-17 NOTE — Assessment & Plan Note (Signed)
[facility]                          CHRONIC HEART FAILURE NOTE   NAME:COBBSTherin, Vetsch                        MRN:          [id number]  DATE:03/13/2008                            DOB:          January 02, 1951    PRIMARY CARE PHYSICIAN:  Dr. [name], [place], [facility]   PRIMARY CARDIOLOGIST:  [name], MD   HISTORY AND PHYSICAL:  [name] returns today for further followup of his  congestive heart failure, which is secondary to diastolic dysfunction in  the setting of previously poorly controlled hypertension and medical  noncompliance secondary to extreme sensitivity to adjustments in his  blood pressure medications.  Mateusz has been doing well since I last saw  him in July.  He has finally gotten approved for Medicaid.  This should  help him with his primary care issues.  As he has not been seen a  physician other than coming here to [facility] because of  financial issues.  He is a diabetic, states his blood sugars are running  around 130 fasting in the morning.  He continues to have gout pain  periodically, continues to have dizziness and increased grogginess with  his clonidine doses for about an hour, after he takes it twice a day and  then he states he feels okay at other than that.  He is able to push-mow  his lawn without problems.  He has to stop several times to take a  break, he does some experience some lower extremity edema, if he stands  on his feet too much, but in further discussion with him, he is still  adding salt in his food.  Reviewing his fluid intake,  he is also  drinking too much water during the day.   PAST MEDICAL HISTORY:  1. Congestive heart failure secondary diastolic dysfunction with EF      currently 50%.  2. Nonobstructive coronary artery disease confirmed by catheterization      in 2008.  3. Previously poorly controlled hypertension.  4. Morbid obesity.  5. Obstructive sleep apnea with  partial CPAP compliance.  6. Type 2 diabetes.  7. Atypical chest pain prompting cardiac catheterization in 2008      showing nonobstructive disease.  8. Dyslipidemia.  9. Gout.  10.Chronic renal insufficiency with creatinine 1.7 and 1.8.   REVIEW OF SYSTEMS:  As stated above, otherwise negative.   CURRENT MEDICATIONS:  1. Atacand 32 daily.  2. Allopurinol 100 b.i.d.  3. Aspirin 81 daily.  4. Lasix 40 mg b.i.d.  5. Spironolactone 12.5 mg b.i.d.  6. Clonidine 0.3 mg, the patient takes half a tablet twice a day.  7. Caduet 10/40 mg daily.  8. Carvedilol 25 b.i.d.  9. Glyburide and metformin 5/500 b.i.d.  10.P.r.n. O2, 3 L nasal cannula, and the patient should wear his CPAP      at bedtime, but he wears it sporadically.   PHYSICAL EXAMINATION:  VITAL SIGNS:  Weight today is 272 pounds, blood  pressure 152/90 with a heart rate of 98.  GENERAL:  Sadiq is in no acute distress.  NECK:  No signs of jugular vein distention at 45 degrees angle.  LUNGS:  Clear to auscultation, muffled breath sounds.  CARDIOVASCULAR:  Reveals S1 and S2, slightly tachycardiac.  ABDOMEN:  Morbidly obese, soft, nontender, positive bowel sounds.  EXTREMITIES:  Lower extremities with a trace of edema bilaterally.   IMPRESSION:  Congestive heart failure secondary diastolic dysfunction.  The patient with mild lower extremity edema in the setting of salt use.  Recently an increased fluid intake.  Cautioned him again about use in  any additional salt in his diet and cut his fluid intake back to 8  ounces glasses of any liquid of day during 24-hour period.  [place] blood  pressure still not optimally controlled.  I had it down lower than this  a few months ago, but he has gained some weight back.  I am going to  check blood work today on him, chemistry and BMP and then I will make  adjustments in his medication.  I suspect I will increase his carvedilol  to 37.5 mg b.i.d. and increase his spironolactone.  He still is  rather  sensitive to the clonidine, but states he is taking it.  I may review  his medicines with Dr. Antoine Poche here today and make changes or may just  have [name] follow up with Dr. Gala Romney his primary cardiologist in the  next month or so and adjust medications.      [name], ACNP  Electronically Signed      Rollene Rotunda, MD, [facility]  Electronically Signed   MB/MedQ  DD: 03/13/2008  DT: 03/13/2008  Job #: [id number]

## 2010-12-17 NOTE — Assessment & Plan Note (Signed)
[facility]                          CHRONIC HEART FAILURE NOTE   NAME:[name], [name]                        MRN:          [id number]  DATE:06/21/2007                            DOB:          May 05, 1951    PRIMARY CARE PHYSICIAN:  Dr. Olena Leatherwood   PRIMARY CARDIOLOGIST:  Nicholes Mango, MD.   Mr, [name] returns today for followup of his congestive heart failure  which is secondary to diastolic dysfunction in the setting of previously  poorly controlled hypertension.  [name] has been slowly adjusting to  a controlled blood pressure, but is having the extreme side effects  secondary to medications with ongoing generalized weakness, fatigue and  extreme dizziness.  I recently had him complete a cardiopulmonary  exercise test for further evaluation secondary to his symptoms.  Preliminary report shows exercise testing with exchange demonstrated  moderately reduced functional capabilities when compared to sedentary  match norms.  Primary cause of the limitation appeared to be  hypoventilation related to his morbid obesity.  However, final  interpretation/reading is pending.  [name] states he was unable to  take his medicine on the morning of his CPX test because he states if he  had took it he would not have been able to walk on the treadmill because  it makes him feel so lightheaded and dizzy; but he does state compliance  with medications otherwise.  He continues to be out of work secondary to  his extreme dizziness with his medications.   PAST MEDICAL HISTORY:  1. Heart failure, secondary diastolic dysfunction with EF currently      50% by cath in June 2008.  2. Poorly controlled hypertension.      a.     Status post renal Doppler with poor quality exam.      b.     CT showing patent single renal artery.  No evidence of       proximal or osteo-renal vascular disease.  3. Morbid obesity.  4. Atypical chest pain, status post cardiac catheterization  showing      nonobstructive disease in June 2008.  5. Chronic renal insufficiency with creatinine 1.7 to 1.8.  6. Type 2 diabetes.  7. Obstructive sleep apnea with partial compliance to CPAP.  8. Dyslipidemia with patient not taking statin medication at all times      secondary to financial issues.   REVIEW OF SYSTEMS:  As stated above.   CURRENT MEDICATIONS:  1. Aspirin 81.  2. Lasix 80 b.i.d.  3. Glyburide and metformin 5/500 b.i.d.  4. Clonidine 0.3 mg.  The patient takes half a tablet b.i.d.  5. Spironolactone 12.5 b.i.d.  6. Labetalol 300 t.i.d.  7. Caduet 10/40 daily.  8. Atacand 16 mg daily.   PHYSICAL EXAM:  Weight 270 pounds.  Weight is stable from previous  exams.  Blood pressure 134/90 with a heart rate of 88.  [name] is in no apparent distress.  Unable to evaluate for JVD secondary to body habitus.  LUNGS:  Clear to auscultation.  Distant breath sounds.  CARDIOVASCULAR:  S1 and S2,  regular rate and rhythm .  ABDOMEN:  Obese, soft, nontender.  Positive bowel sounds.  LOWER EXTREMITIES:  Without cyanosis, clubbing, or edema.  NEUROLOGIC:  Alert and oriented x3.   IMPRESSION:  Diastolic heart failure with patient having poor tolerance  to medication.  I just recently increased his Atacand to 32 mg and cut  his clonidine back to half a tablet to see if this will improve his  symptoms of feeling fatigued all the time.  I am going to arrange for  [name] to wear a 24-hour blood pressure cuff to see exactly what his  blood pressure is doing at home.  The goal will be to try and get his  clonidine down to 0.1 mg b.i.d. to titrate his Atacand up.  I would like  to switch his labetalol to carvedilol 25 mg b.i.d.  I have instructed  him to finish up the medication he has at home on the labetalol, and  then start a prescription for the carvedilol 25 mg b.i.d., and I will  titrate that dose as needed.  Maybe patient will report improved  tolerance.  The patient needs to  follow up with Dr. Gala Romney for a  routine cardiology visit also.  We will arrange this at next  appointment.      [name], ACNP  Electronically Signed      Rollene Rotunda, MD, [facility]  Electronically Signed   MB/MedQ  DD: 06/21/2007  DT: 06/22/2007  Job #: [id number]

## 2010-12-17 NOTE — Assessment & Plan Note (Signed)
[facility]                            CARDIOLOGY OFFICE NOTE   NAME:COBBSMarkevion, [name]                        MRN:          [id number]  DATE:11/03/2007                            DOB:          October 18, 1950    PRIMARY CARE PHYSICIAN:  Dr. Olena Leatherwood in [place], [place].   INTERVAL HISTORY:  [name] is a 60 year old male with a history of  difficult to control hypertension, diastolic heart failure, morbid  obesity, chronic renal insufficiency, baseline creatinine of 1.8,  diabetes, obesity and sleep apnea on CPAP.  He returns today for routine  follow-up.  He has been followed by [name] in the heart failure  clinic.   He says overall he is doing fairly well.  He continues to get short of  breath when he walks too far, but says he can walk the length and around  [employer] without much difficulty.  He does get short of breath upstairs.  He notes that when he walks around, his feet and ankles swell; however,  he does not have swelling at other points.  He continues with episodes  of chest pain, which he feels like having a catch in his ribs.  He did  have cardiac catheterization in 2008, which showed nonobstructive  coronary disease.  He has been quite compliant with his medications and  said his blood pressure has been well-controlled.   CURRENT MEDICATIONS:  1. Atacand 32 a day.  2. Allopurinol 100 b.i.d.  3. Aspirin 81 a day.  4. Lasix 40 in the morning and 80 at night.  5. Spironolactone 12.5 b.i.d.  6. Clonidine 0.15 mg b.i.d.  7. Caduet 10/40.  8. Carvedilol 25 b.i.d.  9. Glyburide/metformin 5/500 b.i.d.   PHYSICAL EXAMINATION:  GENERAL:  He is no acute distress.  He ambulates  around the clinic without respiratory difficulty.  VITAL SIGNS:  Blood pressure is 122/74, heart rates is 104.  Weight 267,  which is stable.  HEENT:  Normal.  NECK:  Thick and supple.  Unable to clearly assess JVD, but it appears  flat.  CARDIAC:  PMI is  nondisplaced.  He has distant heart sounds.  Heart is  regular, an S4, no murmur.  LUNGS:  Clear.  ABDOMEN:  Obese, nontender, nondistended.  Unable to evaluate for  hepatosplenomegaly.  No bruits, no masses.  EXTREMITIES:  Warm with no cyanosis, clubbing or edema.  No rash.  NEURO:  Alert and oriented x3.  Cranial nerves II-XII are intact.  Moves  all four extremities without difficulty.   EKG shows sinus tachycardia at a rate 104, no ST-T wave abnormalities.   ASSESSMENT/PLAN:  1. Diastolic heart failure.  He is doing quite well.  Volume status      looks good.  He continues to be New York Heart Association class II-      III.  I would not make any changes at this time.  2. Hypertension, very well-controlled.  Continue current therapy.  3. Chest pain.  I suspect this is chest wall pain.  His      catheterization is  reassuring.  No further workup.  4. Obesity.  I once again reminded him that he needs to lose weight      and exercise.  I did suggest that he could wear compression      stockings during exercise to help with his edema as needed.     [name], MD  Electronically Signed    DRB/MedQ  DD: 11/03/2007  DT: 11/03/2007  Job #: [id number]   cc:   [name]

## 2010-12-17 NOTE — Assessment & Plan Note (Signed)
[facility]                            CARDIOLOGY OFFICE NOTE   NAME:[name], [name]                        MRN:          [id number]  DATE:08/19/2007                            DOB:          1951/04/27    PRIMARY CARE PHYSICIAN:  Dr. Olena Leatherwood.   INTERVAL HISTORY:  [name] is a delightful 59 year old male with a  history of diastolic heart failure in the setting of severe  hypertension. He also has chronic renal insufficiency, baseline  creatinine between 1.7 and 1.9, diabetes, sleep apnea, hyperlipidemia,  and chest pain with nonobstructive coronary artery disease by  catheterization in 2008. He returns today for routine followup.   He has been following closely with [name] in the heart failure  clinic, and she has been titrating his blood pressure medication. He has  had good results, and he says his blood pressure is as good as it has  been in a long time. Average blood pressure at home has really been in  the 120 to 130 range. This has been complicated by the fact that after  he takes his blood pressure medication he also feels dizzy and  lightheaded and has to rest for a couple of hours before he can get  going, and then he feels fine.   He has only been intermittently compliant with his CPAP. He wears it for  about four hours a night. He denies any orthopnea, PND, or lower  extremity edema.   MEDICATIONS:  1. Aspirin 81 a day.  2. Lasix 80 b.i.d.  3. Glyburide/metformin 5/500 b.i.d.  4. Clonidine 3 mg tablets, half tablet b.i.d.  5. Spironolactone 12.5 b.i.d.  6. Caduet 10/40 a day.  7. Carvedilol 25 b.i.d.  8. Atacand 32 mg a day.  9. Oxygen 3 liters p.r.n.   PHYSICAL EXAMINATION:  He is a morbidly obese man in no acute distress.  He ambulates around the clinic without any respiratory difficulty. Blood  pressure manually in the left arm is 142/90, heart rate is 86, weight is  267 which is down 4 pounds from before.  HEENT:   Is normal.  NECK:  Is supple and thick. It is hard to see his JVD. Carotids are 2+  bilaterally without bruits. There is no lymphadenopathy or thyromegaly.  CARDIAC:  PMI is not palpable. There is a regular rate and rhythm with a  S4. No murmurs.  LUNGS:  Are clear.  ABDOMEN:  Is obese, nontender, nondistended. Unable to appreciate any  hepatosplenomegaly, bruits or masses. There are no renal bruits  appreciated.  EXTREMITIES:  Are warm with no clubbing, cyanosis, or edema. No rash.  NEUROLOGICAL:  Alert and oriented x3. Cranial nerves II-XII are intact.  Moves all 4 extremities without difficulty. Affect is very pleasant.   EKG shows normal sinus rhythm with minimal prolongation of his QT  interval with a QTC of 483 milliseconds.   ASSESSMENT AND PLAN:  1. Hypertension. Blood pressure control is much improved. However, he      is still a little bit elevated. He did not take his blood  pressure      medications today as he was driving. I wonder if some of his      symptoms may be due to the fact that we are diuresing him a bit too      hard and could back off on his diuretics a little bit in favor of      more vasoactive drugs. We will decrease his Lasix to 40 mg in the      morning and keep it at 80 mg at night and see where this goes. I      told him if he notices fluid coming back that he should bump his      Lasix back up to previous dose. Could consider increasing his      clonidine a little bit once his dizziness improves.  2. Chest pain. I suspect this may be due to an elevated left      ventricular end-diastolic pressure. He has nonobstructive coronary      artery disease by recent catheterization.  3. Diastolic heart failure. This is stable.   DISPOSITION:  He will return to see [name] in one month and  come back to see me in three months to see how he is doing.     [name], MD  Electronically Signed    DRB/MedQ  DD: 08/19/2007  DT: 08/19/2007   Job #: [id number]   cc:   [name], ACNP  [name]

## 2010-12-17 NOTE — Assessment & Plan Note (Signed)
[facility]                          CHRONIC HEART FAILURE NOTE   NAME:COBBSEmonte, Dieujuste                        MRN:          [id number]  DATE:05/25/2007                            DOB:          [birth year]    PRIMARY CARE:  [name], M.D.   PRIMARY CARDIOLOGIST:  Dr. Gala Romney.   Mr. Windhorst returns today for followup of his congestive heart failure  which is secondary to diastolic dysfunction in the setting of poorly  controlled hypertension in the past.  Mr. Surratt states compliance with  his medications over the last few months that I have been following him.  He does complain of ongoing dizziness, fatigue and feelings of being  washed out with his medication.  Mr. 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  Mr. Hangartner has had ongoing financial  issues and has relied on medication samples for some of his medications.  His girlfriend is very supportive of him and helps him maintain his  medications and diet appropriately.  When I last saw Mr. Moomaw in  September I had asked him to try and increase his activity level, hoping  that this would improve his symptoms and assist with weight loss.  He  states he has been trying to walk but he becomes too dizzy and he has  symptoms of decreased sensation in his legs.  I had asked him in the  past to try some over-the-counter meclizine to see if this improved his  dizziness at all.  He states he tried it but it made him so sleepy that  he stopped taking it.  In regards to his diabetes, he states his CBGs,  which he checks sporadically, last night it was 126 about 2 hours after  dinner.  He states compliance with the glyburide and metformin.  He is  complaining of some visual disturbances, states he has not seen an eye  doctor in years; I have asked him to follow up with an optometrist for  further evaluation, he has  not scheduled this appointment yet.  He also  has not been compliant with his CPAP.  He states he cannot tolerate  wearing it all night.  If he wakes up and takes it off he cannot go back  to sleep with it on.   PAST MEDICAL HISTORY:  1. Heart failure secondary to diastolic dysfunction with EF currently      50% by cardiac catheterization in June of 2008.  2. Poorly controlled hypertension.      a.     Status post renal Doppler with poor quality exam.  Patient       sent for CT showing patent single renal arteries, no evidence of       proximal or osteorenal vascular disease.  Mild aortic       atherosclerosis without aneurysm or occlusive disease.  Patent       visceral arteries.  3. Morbid obesity.  4. Atypical chest pain, status post  cardiac catheterization in June of      2008 showing nonobstructive disease.  5. Chronic renal insufficiency with creatinine 1.7 to 1.8.  6. Type 2 diabetes.  7. Obstructive sleep apnea with partial compliance with CPAP.  8. Dyslipidemia with patient not taking statin medication at this time      secondary to financial issues.   REVIEW OF SYSTEMS:  As stated above.   CURRENT MEDICATIONS:  1. Aspirin 81 mg daily.  2. Lasix 40 mg, two tablets b.i.d.  3. Glyburide/metformin 5/500 b.i.d.  4. Clonidine 0.3 mg b.i.d.  5. Spironolactone 12.5 mg b.i.d.  6. Labetalol 300 mg t.i.d.  7. Caduet 10/40 mg daily.  8. Atacand 16 mg daily.   Mr. Hollett is in no acute distress.  Blood pressure 146/94, manually  160/100 with a heart rate of 90, resting heart rate 90, with ambulating  heart rate increased to 111, resting pulse ox. 98%, after ambulating  approximately 300 feet pulse ox. dropped to 88%.  Mr. Lafoy has no signs  of jugular vein distention at 45 degree angle.  LUNGS:  Clear to auscultation with distant breath sounds in bilateral  bases, poor inspiratory effort.  CARDIOVASCULAR EXAM:  An S1 and S2, regular rate and rhythm.  ABDOMEN:  Soft, nontender,  positive bowel sounds, obese.  LOWER EXTREMITIES:  Without clubbing or cyanosis or peripheral edema.  NEUROLOGICALLY:  Alert and oriented x3.   IMPRESSION:  Diastolic heart failure with ongoing elevated blood  pressure with patient having poor tolerance to medication.  At this time  I am going to decrease his clonidine back to 0.2 milligrams twice daily  and increase his Atacand to 32 milligrams, see if this improves any of  his symptoms of feeling washed out all the time.  In regards to his  visual disturbances, once again I have asked him to follow up with eye  doctor.  I will go ahead and check blood work on him today including  BMET, BNP, TSH, hemoglobin A1c and LFT and lipids as patient has not  eaten.  I suspect he will need to be restarted on a statin as he  previously stopped this medication.  Once again, I have encouraged him  to remain compliant with his CPAP for his obstructive sleep apnea as  this would help in his daytime fatigue and weakness and I am going to go  ahead and  schedule patient for a CPX test for further evaluation of his heart  failure.  I suspect deconditioning and obesity possible causes of his  symptoms more so than his heart failure.     [name], ACNP  Electronically Signed      [name], MD  Electronically Signed   MB/MedQ  DD: 05/25/2007  DT: 05/25/2007  Job #: [id number]   cc:   [name]

## 2010-12-17 NOTE — Assessment & Plan Note (Signed)
[facility]                          CHRONIC HEART FAILURE NOTE   NAME:[name], [name]                        MRN:          [id number]  DATE:02/02/2007                            DOB:          Nov 05, 1950    Mr. [name] is new to the Heart Failure Clinic.  He is a 60 year old  African American gentleman recently discharged from [facility].   PRIMARY CARE PHYSICIAN:  Dr. [name] up in [place].   CARDIOLOGIST:  Calton Dach, MD.   Mr. [name] was discharged from [facility] on January 30, 2007  status post admission for chest pain.  Mr. [name] was initially admitted  at [facility] with complaints of chest pain and diagnosed with  congestive heart failure.  Echocardiogram showed an EF of approximately  50%.  He was referred to [facility] for right and left heart cath in the  setting of his new onset of heart failure and to rule out ischemic  disease.  Dr. [name] proceeded with a right and left heart cath on January 27, 2007.  Patient was found to have nonobstructive disease with  elevated left ventricular filling pressures, most likely secondary to  hypertensive heart disease.  Fick cardiac output was 7.5 liters per  minute, pulmonary artery pressure 46/23 with a mean of 35, wedge  pressure of 36.  Left ventricular pressure was 166/30.  Dr. [name]  recommended aggressive treatment of his hypertension and patient was  referred for further management of his diastolic heart failure.  Mr.  Stranahan complains of ongoing fatigue and lightheadedness and dizziness  since he went home.  He is accompanied by his girlfriend [name] today,  they live together in [place], [place].  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  He states he has had high blood pressure since he was a  teenager, previous history of excessive salt use, he states he is down  to not adding any salt to his food, he still has fast food occasionally.  He states with his job it is difficult not to  grab fast food when he is  on the road.  He has a history of diabetes, does not consistently check  his sugars.  He also has a history of sleep apnea, but states he only  wears the CPAP 2 hours a night.  Patient reports that is what he was  instructed to do when he was set up with the CPAP machine in  [place], [place] years ago.  His girlfriend is going to call and clarify  the instructions regarding that.  Mr. [name] states he has been compliant  with his medication since he was discharged home, was not always  compliant with them prior to his hospitalization.  Apparently he was on  labetalol 600 mg b.i.d. and clonidine 0.2 mg b.i.d. at home in addition  to aspirin, Glucovance, and furosemide.  When he presented to [facility] initially his blood pressure was 188/124, patient was also  having chest discomfort at that time.  He states he has been checking  his pressure since he  went home but he is not sure that his machine is  accurate and he can not actually recall the numbers that he has been  getting as he did not bring it with him.  There is also some discrepancy  in his medications.  There is not a discharge summary from the hospital  but he does have his pink sheet that he was given at time of discharge.  According to the discharge medication list, the patient went home on the  clonidine, Lasix, labetalol, Norvasc, and BiDil.  The patient states he  does not have Norvasc and he did not get the prescription for the BiDil  filled.  He is also complaining of fatigue, dizziness, blurred vision,  and a presyncopal episode since he was discharged home.  Denied any  chest pain or palpitations.   PAST MEDICAL HISTORY:  1. Poorly controlled hypertension for many years.  2. Diabetes.  3. Obesity.  4. Chest pain, status post cardiac catheterization showing mild      nonobstructive disease.  5. Congestive heart failure, diastolic in nature secondary to      nonischemic  cardiomyopathy, most likely secondary to labile      hypertension.  6. Chronic renal insufficiency, creatinine at time of discharge was      1.43.  7. Type 2 diabetes.  8. Some hypokalemia during recent hospitalization.  9. Small pericardial effusion noted on echocardiogram done at [facility] prior to this admission.  10.Obstructive sleep apnea with CPAP use, will need further      clarification of order.  11.Dyslipidemia.   REVIEW OF SYSTEMS:  As stated above.   CURRENT MEDICATIONS:  Note, these are the medicines that patient is  actually taking.  1. Aspirin 81 mg he takes 4 tablets a day.  2. Clonidine 0.2 mg b.i.d.  3. Lasix 40 mg 2 tablets b.i.d.  4. Labetalol, patient has taken 300 mg three tablets b.i.d.  Note, the      prescription at time of discharge states 800 mg b.i.d.  5. Glyburide.  6. Metformin 5/500 b.i.d.  7. K-Dur 20 mEq 2 tablets b.i.d.  8. Simvastatin 40 mg nightly.  9. Patient is not taking Norvasc or the BiDil.   CLINICAL DATA:  Orthostatic vital signs:  Blood pressure lying 182/99,  heart rate 81; sitting 133/95, heart rate 91; standing at 0 minutes  127/90 with a heart rate of 94; two minutes 142/95 with a heart rate of  91; standing at 5 minutes 152/101 with a heart rate of 86.   PHYSICAL EXAMINATION:  Weight 272, blood pressure 160/97 with a heart  rate of 83.  [name] is in no acute distress.  He is a pleasant, obese  60 year old African American gentleman.  Exophthalmos, sclerae injected.  NECK:  Reveals no jugular vein distention but difficult to evaluate  secondary body habitus.  LUNGS:  Patient has scattered rhonchi throughout and some expiratory  wheezes in the bilateral upper lobes.  CARDIOVASCULAR:  Reveals an S1 and S2, positive S3.  ABDOMEN:  Soft, positive bowel sounds, protuberant.  LOWER EXTREMITIES:  Without clubbing, cyanosis, or edema.  NEUROLOGICALLY:  Patient is alert and oriented x3.  Movement of  extremities x4,  ambulating in clinic without assistance, gait is steady.   IMPRESSION:  Diastolic heart failure secondary to poorly controlled  hypertension.  Patient is presenting with complaints of lightheadedness,  dizziness, and presyncopal episode this morning.  Orthostatic vital  signs however  do not support hypotensive nature of syncope.  I suspect  that patient has had such poorly controlled hypertension for so long  that his symptoms are coming from medication response, he is however  taking 900 mg of the labetalol twice a day.  I am going to cut him back  to 600 mg b.i.d., continue the clonidine at 0.2 mg b.i.d., continue the  Lasix 80 b.i.d.  Checked lab work today, also check BNP and a renal  ultrasound as I do not see anywhere in the notes from [facility] that  patient's renal status has been evaluated for renal artery stenosis,  although I have limited documentation from [facility] and Dr. Olena Leatherwood  will hold the BiDil, Norvasc at this time as I suspect the patient would  not take these medications anyway as he already feels poorly enough with  medication that he is taking.  Also, there is a financial issue here, as  he can not afford to have medications filled.  I will see patient back  in a couple of weeks after he gets his renal ultrasound done.      [name], ACNP  Electronically Signed      Rollene Rotunda, MD, [facility]  Electronically Signed   MB/MedQ  DD: 02/02/2007  DT: 02/03/2007  Job #: [id number]   cc:   [name]

## 2010-12-17 NOTE — Assessment & Plan Note (Signed)
[facility]                          CHRONIC HEART FAILURE NOTE   NAME:[name], [name]                        MRN:          [id number]  DATE:02/02/2008                            DOB:          1951-01-08    PRIMARY CARE PHYSICIAN:  Dr. Olena Leatherwood in [place], [place].   PRIMARY CARDIOLOGIST:  [name], MD   [name] returns today for further followup of his congestive heart failure  which is secondary to diastolic dysfunction in the setting of previously  poorly-controlled hypertension.  [name] has been very sensitive to  adjustments in his blood pressure medications, experiencing extreme  lightheadedness and dizziness, which resulted in noncompliance.  I over  the last 6 months slowly adjusted his medications, including dosages and  times or staggering the medications, to obtain optimal control of his  blood pressure without him experiencing side effects.  He has done quite  well on the current regimen.  He saw Dr. Gala Romney in April 2009.  His  blood pressure was 122/74, which was a significant improvement for  [name].  Today, his blood pressure is elevated, however, for several  reasons.  He relies on Atacand samples that I give him here in the  office and he has been out of the samples for about a week.  Also, he  has had some pain with his gout, which always makes his blood pressure  go up and, most importantly, he has suffered a tremendous loss in his  family with a cousin murdering his spouse and 3 children and then  killing himself and then a resultant another cousin having a massive  myocardial infarction from the stress of it.  [name] has been under a  significant amount of stress.  The whole family is in shock from the  unexpected murder/suicide.  He states he has not been sleeping well, has  been nauseated, actually had a recent GI virus after eating a tuna sub.  He had a week of nausea, vomiting, and diarrhea also.  Blood pressure  at  home runs 130s over 70s to 80s.  He states his sugars also went up  during all the increased stress up to 250, but he has gotten these back  under control.  CBGs were in the 120-127 now in the mornings, which is  amazingly well for him also.  [name] is pending medical assistance.  He  states he has been told that he should be receiving disability and  Medicaid.  This has been a big relief for him as he has medical bills  and has been extremely stressed over not being able to pay them.  He  denies any symptoms suggestive of volume overload.  He has chronic  dyspnea on exertion, most likely secondary to multiple conditions  including his morbid obesity.  Activity is somewhat limited depending on  how much his gout is bothering him.  He still has transient dizziness in  the morning after taking his initial medications, but this is more  tolerable.   PAST MEDICAL HISTORY:  1. Heart failure secondary to diastolic dysfunction, with  ejection      fraction currently 50% by catheterization in June 2008.  2. Previously poorly-controlled hypertension.  3. Morbid obesity.  4. Atypical chest pain prompting cardiac catheterization in 2008      showing nonobstructive disease.  5. Chronic renal insufficiency with creatinine 1.7-1.8.  6. Type 2 diabetes.  7. Obstructive sleep apnea with CPAP compliance.  8. Dyslipidemia.  9. Gout.   REVIEW OF SYSTEMS:  As stated above, otherwise negative.   CURRENT MEDICATIONS:  1. Atacand 32 mg daily (the patient has been out of this for a week).  2. Allopurinol 100 mg b.i.d.  3. Aspirin 81 mg daily.  4. Lasix 40 mg in the morning and 80 in the afternoon.  5. Spironolactone 12.5 b.i.d.  6. Clonidine 0.15 mg b.i.d.  7. Caduet 10/40 mg daily.  8. Carvedilol 25 mg b.i.d.  9. Glyburide and metformin 5/500 mg b.i.d.  10.Oxygen 3 L p.r.n.  11.CPAP nightly.   PHYSICAL EXAMINATION:  Weight 272 pounds, weight is up 5 pounds from  April 2009; blood pressure left  arm 175/118, right arm 177/106, with a  heart rate of 90.  [name] is in no acute distress.  NECK:  Without obvious JVD but difficult to assess secondary to size of  neck.  CARDIOVASCULAR:  Distant heart sounds.  S1 and S2.  LUNGS:  Clear to auscultation bilaterally.  ABDOMEN:  Obese, soft, and nontender.  Positive bowel sounds.  LOWER EXTREMITIES:  Without clubbing, cyanosis, or edema.  NEUROLOGICAL:  Alert and oriented x3, ambulating in clinic without  assistance.   IMPRESSION:  Congestive heart failure, diastolic in nature.  Fluid  volume status is good.  The patient is hypertensive today, but he has  not had any of his blood pressure medicines yet this morning.  I will  not make any changes in his medicines at this time, and his blood  pressure at home is acceptable from his documented readings.  Shown  states he has not seen Dr. Bradly Bienenstock in a while because he owes money  there at the clinic.  I strongly encouraged him to follow up  with primary care associates Medicaid goes through for further  management of his diabetes and primary care issues.      [name], ACNP  Electronically Signed      [name], MD  Electronically Signed   MB/MedQ  DD: 02/02/2008  DT: 02/02/2008  Job #: [id number]   cc:   [name]

## 2010-12-17 NOTE — Assessment & Plan Note (Signed)
[facility]                          CHRONIC HEART FAILURE NOTE   NAME:[name], [name]                        MRN:          [id number]  DATE:06/20/2008                            DOB:          [birth year]    PRIMARY CARDIOLOGIST:  [name], MD   PRIMARY CARE PHYSICIAN:  Criag does not have a PCP at this time.  We  will try to establish care with a new physician.   HISTORY OF PRESENT ILLNESS:  [name] returns today for further followup of  his congestive heart failure, which is secondary to diastolic  dysfunction in the setting of previously poorly controlled hypertension  and ongoing medical noncompliance secondary to extreme sensitivity to  adjustments in his blood pressure medications.  I had felt Maximiano alone  will sort out plans in regards to blood pressure control over the last  year and he has tolerated medications without problems.  However, he ran  out of Lasix 7 days ago and has not refilled this medication.  He also  ran out of his spironolactone and did not refill that.  Kyl states  today that he feels much better without these medications, especially  the spironolactone.  He does not wish to resume the spironolactone.  He  states the dizziness is completely gone and he actually drove here  himself today.  He usually has a family member to bring him because he  gets so dizzy in the morning after his blood pressure medicines.  Other  than that, he states he has been feeling fine.  No problems with his  gout.  Blood sugars have been running 200s-300s, but he has been out of  his diabetic medications also.   PAST MEDICAL HISTORY:  1. Congestive heart failure secondary to diastolic dysfunction with an      EF 50%.  2. 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  3. Hypertension.  4. Morbid obesity.  5. Obstructive sleep apnea with partial compliance to CPAP.  6. Type 2 diabetes.  7. Dyslipidemia.  8. Gout.  9. Chronic renal  sufficiency with creatinine 1.7-1.8.   REVIEW OF SYSTEMS:  As stated above.   CURRENT MEDICATIONS:  1. Atacand 32.  2. Allopurinol 100 b.i.d.  3. Aspirin 81.  4. Glyburide/metformin 5/500 b.i.d.  5. Lasix 40 mg b.i.d.  6. Spironolactone 12.5 mg b.i.d.  7. Coreg 50 mg b.i.d.  8. Chlorthalidone 25 mg daily.  9. KCl 20 mEq daily.  10.Zocor 80 mg daily.  11.Amlodipine 2 mg daily.  12.Simvastatin 80 mg daily.   PHYSICAL EXAMINATION:  VITAL SIGNS:  Weight is 272 pounds, weight is up  3 pounds; blood pressure 137/87 with heart rate of 85.  GENERAL:  Mitcheal is in no acute distress at this time.  NECK:  Supple and thick, unable to assess for JVP, does not appear to be  elevated.  CARDIOVASCULAR:  S1 and S2.  Regular rate and rhythm.  LUNGS:  Clear to auscultation.  ABDOMEN:  Obese, soft, and nontender.  Positive bowel sounds.  LOWER EXTREMITIES:  Without clubbing or cyanosis.  He has +1 pitting  edema bilaterally.  NEUROLOGICAL:  Alert and oriented x3.   IMPRESSION:  Congestive heart failure secondary to diastolic dysfunction  with medical noncompliance.  [name] does not want to restart the  spironolactone.  He is agreeable to resuming the furosemide 40 mg b.i.d.  In lieu of the spironolactone, we will increase his KCl to 20 mEq  b.i.d., and check blood work today.      [name], ACNP  Electronically Signed      [name], MD  Electronically Signed   MB/MedQ  DD: 06/20/2008  DT: 06/21/2008  Job #: [id number]

## 2010-12-17 NOTE — Discharge Summary (Signed)
[name] NO.:  [id number]   MEDICAL RECORD NO.:  [id number]          PATIENT TYPE:  INP   LOCATION:  4735                         FACILITY:  [facility]   PHYSICIAN:  Doylene Canning. Ladona Ridgel, MD    DATE OF BIRTH:  14-Sep-1950   DATE OF ADMISSION:  01/26/2007  DATE OF DISCHARGE:  01/30/2007                               DISCHARGE SUMMARY   PRIMARY CARDIOLOGIST:  Dr. Daiva Nakayama and [name], ACNP.   PRIMARY CARE PHYSICIAN:  Dr. Olena Leatherwood in [place], [place].   PROCEDURES PERFORMED DURING HOSPITALIZATION:  Cardiac catheterization  per Dr. Tonny Bollman on January 30, 2007 revealing nonobstructive  coronary artery disease, elevated left ventricular filling pressures  likely secondary to hypertensive heart disease.  [name] will require  medical therapy for his heart failure and coronary artery disease.  He  does not require revascularization as he has minor, nonobstructive  disease.  The only significant lesion from a hemodynamic standpoint is  likely the diagonal branch, and does not warrant revascularization at  this point.  He needs diuresis and aggressive treatment of his  hypertension.   DISCHARGE DIAGNOSES:  1. Acute diastolic congestive heart failure with ejection fraction      50%.  2. Status post cardiac catheterization with new onset of heart      failure.  This is a right and left heart catheterization with      nonobstructive coronary artery disease, with elevated left      ventricular filling pressures.  Peak  cardiac output was 7.5 liters      per minute, pulmonary artery pressure of 46/23 with a mean of 35,      wedge pressure 36, left ventricular pressure 166/30.  3. Uncontrolled hypertension.   SECONDARY DIAGNOSES:  1. Type 2 Diabetes.  2. Obesity.  3. Chronic renal insufficiency with creatinine at the time of      discharge of 1.43.  4. Hypokalemia during recent hospitalization.  5. Small pericardial effusion noted on echocardiogram  done at [facility] prior to admission.  6. Obstructive sleep apnea with CPAP use.  7. Dyslipidemia.   HOSPITAL COURSE:  [name] who presented to  [facility] with CHF.  He had symptoms of angina.  Echocardiogram  demonstrated low-normal left ventricular function with an EF of  approximately 50%.  He was referred to [facility] for right and  left heart catheterization in the setting of new onset of heart failure  and to rule out ischemic heart disease.  The patient was seen and  examined by Dr. Tonny Bollman on arrival from [place], and cardiac  catheterization was completed to include a right and left heart cath.  Please see Dr. Earmon Phoenix thorough dictation note for more details  concerning the catheterization.   The patient was continued on diuresis.  Blood pressure was much better  controlled during hospitalization with institution of Lasix and  antihypertensives.  The patient was continued on CPAP during  hospitalization as well.  On January 30, 2007 the patient was  seen and  examined by Dr. Lewayne Bunting and found to be stable for discharge.  Blood pressure was much better controlled at 160/95, pulse 60,  respirations 18, and was to follow up with Dr. Antoine Poche and [name], nurse practitioner, in post-discharge heart failure clinic.  The  patient's right groin was evaluated, clean and dry without evidence of  hematoma, bleeding or infection.   DISCHARGE LABORATORY DATA:  BNP 116.0, sodium 142, potassium 3.7,  chloride 99, CO2 of 36, glucose 124, BUN 19, creatinine 1.43.  Hemoglobin 11.9, hematocrit 36.6, white blood cells 8.2, platelets 151.  Cholesterol 180, triglycerides 83, HDL 43, LDL 122.  TSH 3.596.   DISCHARGE MEDICATIONS:  1. Labetalol 300 mg twice a day.  2. Clonidine 0.2 mg daily.  3. Glucovance 5 mg twice a day.  4. Lasix 80 mg twice a day.  5. Norvasc 5 mg once a day.  6. Simvastatin 40 mg at bedtime.  7.  Potassium 40 mEq twice a day.  8. BiDil 20/37.5 t.i.d.   ALLERGIES:  No known drug allergies.   FOLLOWUP PLANS AND APPOINTMENTS:  1. The patient has been scheduled with congestive heart failure clinic      to see [name], nurse practitioner for continued management      of his chronic diastolic heart failure.  2. He is to follow up with Dr. Olena Leatherwood for continued medical      management of diabetes.  The patient has been educated on new      medications to include BiDil and Norvasc, along with institution of      simvastatin and increase in Lasix to 80 mg twice a day from prior      dose of 40 mg twice a day.  The patient has also been given post-      cardiac catheterization instructions with particular emphasis on      right groin site for evidence of hematoma, infection, bleeding, and      to call our office immediately.   Time spent with the patient to include physician time:  40 minutes.      Bettey Mare. Lyman Bishop, NP      Doylene Canning. Ladona Ridgel, MD  Electronically Signed    KML/MEDQ  D:  02/15/2007  T:  02/15/2007  Job:  [id number]

## 2010-12-17 NOTE — Assessment & Plan Note (Signed)
[facility]                          CHRONIC HEART FAILURE NOTE   NAME:COBBSGarnet, [name]                        MRN:          [id number]  DATE:04/12/2007                            DOB:          22-Nov-1950    [name] returns today for followup of his congestive heart failure  which is secondary to diastolic dysfunction with  history of poorly  controlled hypertension.  [name] states compliance with his  medications over the last three months, status post recent  hospitalization for further titration of his medications as he remains  chronically dizzy and feeling washed out with his medications.  He  checks his blood pressure at home and states blood pressure runs between  137/97 and 147/80s.  Apparently he is using a wrist blood pressure cuff  at home.  He continues to remain unable to work secondary to the  dizziness.  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  I have helped [name]  by giving him samples of his Caduet and Atacand as he has no insurance  and no disability assistance. His girlfriend is very supportive of him  and helps maintain his medications and diet appropriately.   PAST MEDICAL HISTORY:  1. Heart failure secondary to diastolic dysfunction with an EF      currently 50% by cardiac catheterization in June 2008.  2. Poorly controlled hypertension.  3. Status post cardiac catheterization showing nonobstructive disease.  4. Morbid obesity.  5. Atypical chest pain.  6. Chronic renal insufficiency with creatinine of 1.7 to 1.8.  7. Type 2 diabetes.  8. Obstructive sleep apnea with partial compliance with CPAP.  9. Dyslipidemia.   REVIEW OF SYSTEMS:  As stated above.   CURRENT MEDICATIONS:  1. Aspirin 81 mg daily.  2. Lasix 40 mg two tablets b.i.d.  3. Glyburide/metformin 5/500 b.i.d..  4. Clonidine 0.3 mg b.i.d..  5. Spironolactone 12.5 mg b.i.d.  6. Labetalol 300 mg t.i.d.  7. Caduet 10/40 daily.  8. Atacand 16 mg daily.   PHYSICAL EXAMINATION:  VITAL SIGNS:  Weight 272 pounds.  Blood pressure  140/94,  manual 150/100 with a heart rate of 94.  GENERAL:  [name] is in no acute distress.  No jugular venous  distension noted at 45 degrees.  LUNGS:  Clear to auscultation.  CARDIOVASCULAR:  S1 and S2.  Regular rate and rhythm.  ABDOMEN:  Soft, nontender.  Normoactive bowel sounds.  Morbidly obese.  EXTREMITIES:  Lower extremities without clubbing or cyanosis.  No signs  of peripheral edema.  NEUROLOGIC:  Alert and oriented x3.   IMPRESSION:  Diastolic heart failure with blood pressure still elevated  at this time.  However, the patient continues to complain of increased  dizziness with inability to carry on activities of daily living or  return to his job.  I am going to have him try some over-the-counter  meclizine to see if this improves the dizziness at all.  We will  continue his current blood pressure medicines at the current dose.  I  would like to  increase the Atacand to  32 mg, but I do not think that he  would tolerate it at this time.  I am going to see him back in a few  weeks for further reevaluation.      [name], ACNP  Electronically Signed      Rollene Rotunda, MD, [facility]  Electronically Signed   MB/MedQ  DD: 04/12/2007  DT: 04/12/2007  Job #: [id number]

## 2010-12-17 NOTE — Assessment & Plan Note (Signed)
[facility]                          CHRONIC HEART FAILURE NOTE   NAME:COBBSAudwin, Semper                        MRN:          [id number]  DATE:09/22/2007                            DOB:          1951-03-05    PRIMARY CARDIOLOGIST:  [name], MD.   PRIMARY CARE:  Dr. [name]   Mr. Pitsenbarger returns today for further followup of his congestive heart  failure which is secondary to diastolic dysfunction in the setting of  previously poorly controlled hypertension.  Mr. Siebert is very sensitive  to adjustments in his blood pressure medications.  He has a quite  extensive history of noncompliance, secondary to extreme lightheadedness  and dizziness with his medicines.  We have slowly been adjusting his  medicines over the last few months to obtain optimal control of his  blood pressure without extreme side effects.  He has had previous  extensive workup of his low blood pressure including a 24-hour blood  pressure monitor, renal ultrasound, and renal CT.  He is status post  cardiac catheterization showing no obstructive disease.  Today, his  primary complaint is of gout pain in his left ankle and right elbow, to  the point, that he has had to use a wheelchair at home because he has  been unable to ambulate.  He states he has been taking his allopurinol,  as prescribed, but does not feel like it is helping.  He also continues  to complain of lightheadedness and dizziness for the first 2 hours after  he takes his medicines in the morning; and then this resolves, but he  continues to feel extremely fatigued throughout the day.  He denies any  fluid retention.  He does become dyspneic with minimal exertion; most  likely multifactorial secondary to obesity, and the extreme  deconditioning.  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   PAST MEDICAL HISTORY:  1. Heart failure secondary to diastolic  dysfunction with EF currently      50% by catheterization in June 2008.  2. Poorly controlled hypertension.      a.     Status post renal Doppler with poor quality exam.      b.     CT showing patent single renal arteries, no evidence of       proximal or osteorenal vascular disease.  3. Morbid obesity.  4. Atypical chest pain status post cardiac catheterization showing      nonobstructive disease in 2008.  5. Chronic renal insufficiency with creatinine 1.7-1.8.  6. Type 2 diabetes.  7. Obstructive sleep apnea with partial compliance to CPAP.  8. Dyslipidemia.   REVIEW OF SYSTEMS:  As stated above, otherwise negative.   CURRENT MEDICATIONS INCLUDE:  1. Atacand 2 mg daily.  2. Allopurinol 100 mg daily.  3. Aspirin 81 mg daily.  4. Lasix 40 in the morning and 80 in the afternoon.  5. Spironolactone 12.5 b.i.d.  6. Clonidine 0.3 mg tablet.  The patient takes 1/2 tablet twice a day.  7. Caduet 10/40 daily.  8. Carvedilol 25 mg b.i.d.  9. Glyburide/metformin 5/500 b.i.d. p.r.n.  10.O2 3 liters p.r.n.  11.CPAP nightly, but the patient states he can only wear it for about      4 hours.   PHYSICAL EXAMINATION:  Weight today 265 pounds, blood pressure 165/102.  Note, the patient has not taken any of his medications, yet, today with  a heart rate of 96.  Mr. Vroom  is in no acute distress.  JVD is around 8 cm at a 45-degree angle.  LUNGS:  Clear to auscultation.  CARDIOVASCULAR EXAM:  Reveals an S1-S2 regular rate and rhythm. Positive  S4.  ABDOMEN:  Obese, soft, nontender, positive bowel sounds.  LOWER EXTREMITIES:  Without clubbing, cyanosis.  He has a trace of edema  that is nonpitting.  NEUROLOGICALLY:  Alert and oriented x3   IMPRESSION:  1. Heart failure secondary to diastolic dysfunction with mild fluid      retention.  At this time the patient has not taking any      medications, yet, today.  We will continue Lasix at the current      dose of 40 in the morning and 180 in  the evening.  Note: His Lasix      is being given This way to decrease the sensitivity to a large      number of medications given in the morning contributing to      increased dizziness and lightheadedness.  2. Hypertension.  Will continue current medications.  I have asked the      patient to continue monitoring his blood pressure at home.      Obviously blood pressure is not under optimal control this morning,      but he Mr. Witting has not taking his medication yet today, and he      actually has been out of his Atacand for 3 days.  3. Gout.  The patient obviously is having an acute flare-up.  We will      check a uric acid level.  I am going ahead and put him on a      prednisone taper for a few days.  Hold his allopurinol until his      acute flare-up is resolved, and then resume his allopurinol 100 mg      b.i.d.  4. Compliance.  The patient is doing much better in regards to taking      medications appropriately.  Once again, I have written out explicit      instructions how he is take his medications as I may changes today,      and I will see him back in 4 weeks.  I have also given him enough      samples of the Caduet and Atacand to last him the next month.      Hopefully he      will be able to obtain some assistance, soon, with his medications      as he has not been able to work.      [name], ACNP  Electronically Signed      [name], MD  Electronically Signed   MB/MedQ  DD: 09/22/2007  DT: 09/22/2007  Job #: [id number]   cc:   [name]

## 2010-12-17 NOTE — Assessment & Plan Note (Signed)
[facility]                          CHRONIC HEART FAILURE NOTE   NAME:[name], [name]                        MRN:          [id number]  DATE:07/22/2007                            DOB:          [birth year]    PRIMARY CARDIOLOGIST:  Dr. Nicholes Mango.   PRIMARY CARE PHYSICIAN:  Dr. [name] in [place].   [name] returns today for followup of his congestive heart failure  which is secondary to diastolic dysfunction in the setting of previously  poorly controlled hypertension.  I have been slowly adjusting [name]'s  blood pressure medicines, because he has extreme intolerance to  medications.  I recently had him wear a 24-hour blood pressure  monitoring device at home so that I can adjust his medications according  to his symptoms.  He has extreme dizziness and fatigue with blood  pressure medications.  I staggered his blood pressure medications after  doing his 24-hour monitor, and he has been much more tolerant of  medications since then.  His blood pressure is the most controlled today  it has been since I have been following him, and he is still complaining  of some dizziness, but not as intense as it has been.  He still cannot  take his medicine and drive.  He has to wait 2 or 3 hours before he is  able to do any activity that requires much movement after taking his  medications, but this is a great improvement from previously.  He  continues to be out of work secondary to the extreme dizziness  associated with his medications.  In the past, I have also tried to  medicate him with Antivert, but this causes extreme drowsiness, and,  once again, he is unable to drive because of this.  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  [name] is also complaining of pain in his knees  associated with swelling if he walks  more than a block or so.  He does  have a history of gout.  States the swelling in his knees goes down  after 2 or 3 days, but he continues to have some discomfort there.  He  denies any pain in his feet.  Aside from the dizziness, he denies any  orthopnea, PND, or peripheral edema.  He is complaining of some erectile  dysfunction, but does not wish to take medication for this at this time.   PAST MEDICAL HISTORY:  1. Heart failure secondary to diastolic dysfunction, with an ejection      fraction, currently, 50% by catheterization in June of 2008.  2. Poorly-controlled hypertension.      a.     Status post renal Doppler with poor quality exam.      b.     CT showing patent single renal arteries, no evidence of       proximal or osteo renal vascular disease.  3. Morbid obesity.  4. Atypical chest pain, status  post cardiac catheterization showing      nonobstructive disease.  5. Chronic renal insufficiency with creatine 1.7 to 1.8.  6. Type 2 diabetes.  7. Obstructive sleep apnea and partial compliance to CPAP.  8. Dyslipidemia with patient previously not taking statins secondary      to financial issues.  The patient is being given samples of      medication.   REVIEW OF SYSTEMS:  As stated above.   CURRENT MEDICATIONS:  1. Aspirin 81 mg daily.  2. Lasix 40 mg 2 tablets b.i.d.  3. Glyburide/Metformin 5/500 b.i.d.  4. Clonidine 0.3 mg.  The patient takes half a tablet b.i.d.  5. Spironolactone 12.5 mg b.i.d.  6. Caduet 10/40 mg daily.  7. Atacand 16 mg 2 tablets daily.  8. Carvedilol 25 mg b.i.d.   PHYSICAL EXAMINATION:  Blood pressure 129/85, weight 271, pulse 86.  [name] is in no acute distress.  No signs of jugular vein distension at 45 degree angle.  LUNGS:  Clear to auscultation bilaterally.  CARDIOVASCULAR EXAM:  Reveals an S1 and S2, regular rate and rhythm.  ABDOMEN:  Soft, nontender, positive bowel sounds, obese.  LOWER EXTREMITIES:  Without cyanosis, clubbing, or  edema.  NEUROLOGICALLY:  Alert and oriented x3.   IMPRESSION:  Diastolic heart failure with history of poor hypertension  control.  The patient's blood pressure is currently stable with minimal  side effects from medication.  Will continue his current regimen.  The  patient is due for a routine cardiology visit with Dr. Gala Romney.  I am  going to go ahead and check fasting labs on him today as he has not  eaten, and have him follow up with Dr. Gala Romney for routine visit.  Once again, I have given the patient samples of medication to help him  with cost.      [name], ACNP  Electronically Signed      Rollene Rotunda, MD, [facility]  Electronically Signed   MB/MedQ  DD: 07/22/2007  DT: 07/22/2007  Job #: [id number]   cc:   [name]

## 2010-12-17 NOTE — Assessment & Plan Note (Signed)
[facility]                            CARDIOLOGY OFFICE NOTE   NAME:[name], [name]                        MRN:          [id number]  DATE:04/20/2008                            DOB:          [birth year]    PRIMARY CARE PHYSICIAN:  Dr. Lia [name].   INTERVAL HISTORY:  [name] is a 60 year old male with a history of  congestive heart failure secondary to diastolic dysfunction with an  ejection fraction of 50%, nonobstructive coronary artery disease, morbid  obesity, poorly controlled hypertension, diabetes and obstructive sleep  apnea.  He also has renal insufficiency with a creatinine of 1.7-1.8.   He saw [name] in the [facility] about a month ago and  the plan was to increase his Coreg to 37.5 b.i.d. as well as increase  his spironolactone.  However apparently in the interim, his clonidine  has been stopped.  He presents today saying that he has been swallowing  some.  He also does have occasional chest pain and shortness breath when  mowing the grass.  He denies any orthopnea or PND.  He has been wearing  his CPAP for about 5 hours in night.  For dinner last night, he ate  fried chicken sandwich.   CURRENT MEDICATIONS:  1. Atacand 32 a day.  2. Allopurinol 100 b.i.d.  3. Aspirin 81 a day.  4. Carvedilol 25 b.i.d.  5. Glyburide/metformin 5/500 b.i.d.  6. Lasix 40 b.i.d.  7. Spironolactone 12.5 b.i.d.  8. Labetalol 300 t.i.d.   PHYSICAL EXAMINATION:  GENERAL:  He is in no acute distress, ambulates  around the clinic slowly without any respiratory difficulty.  VITAL SIGNS:  Blood pressure is 180/120, heart rate 90, weight is 271  which is down a pound from before.  HEENT:  Normal.  NECK:  Supple and thick, unable to clearly assess JVD, does not appear  markedly elevated.  Carotids are 2+ bilaterally without any bruits.  There is no lymphadenopathy or thyromegaly.  CARDIAC:  PMI is not palpable.  He is regular with an S4.   No murmurs.  LUNGS:  Clear.  ABDOMEN:  Obese, nontender, nondistended.  No apparent  hepatosplenomegaly.  No bruits, no masses.  Good bowel sounds.  EXTREMITIES:  Warm.  No cyanosis or clubbing.  There is 2+ edema  bilaterally.  No rash.  NEUROLOGICAL:  Alert and oriented x3.  Cranial nerves II through XII are  intact.  Moves all four extremities without difficulty.   ASSESSMENT AND PLAN:  Diastolic heart failure.  He has mild-to-moderate  fluid overload in the setting of severe hypertension and dietary  noncompliance.  It is a bit difficult to know exactly what to do with  him at this time.  What I have chosen to do is to stop his labetalol as  he is on two beta-blockers, we will double his Coreg to 50 b.i.d., start  him on chlorthalidone 25 mg a day to help with his blood pressure and  fluid status, and also potassium 20 mEq a day.  We will see him back  next  week for a volume check and as well as some blood work.  We may  need to continue to increase his diuretics.  I suspect he clearly has a  component of cardiorenal syndrome.   Hypertension.  This is severe, markedly elevated.  We are increasing his  Coreg and adding chlorthalidone.  I suspect we will need to go back on  clonidine next week if his blood pressure is still elevated.     [name]. Bensimhon, MD  Electronically Signed    DRB/MedQ  DD: 04/20/2008  DT: 04/21/2008  Job #: [id number]

## 2010-12-17 NOTE — Assessment & Plan Note (Signed)
[facility]                          CHRONIC HEART FAILURE NOTE   NAME:COBBSGaven, [name]                        MRN:          [id number]  DATE:04/26/2008                            DOB:          [birth year]    PRIMARY CARDIOLOGIST:  [name], MD.   PRIMARY CARE PHYSICIAN:  Dr. [name]   HISTORY OF PRESENT ILLNESS:  [name] returns today for further followup of  his congestive heart failure, which is secondary to diastolic  dysfunction in the setting of previously poorly controlled hypertension  and medical noncompliance secondary to extreme sensitivity to  adjustments in his blood pressure medications.  Over the last 6-8  months, we have been slowly adjusting, titrating [name]' medications for  further tolerance/compliance.  I sent [name] back to Dr. Gala Romney most  recently for his 45-month followup.  At that time, the patient was  started on chlorthalidone 25 mg a day.  His Coreg was increased to 50 mg  b.i.d.  Per Dr. Prescott Gum note from earlier this month, also states  that the patient was taking labetalol and carvedilol.  However, I  stopped the patient's labetalol last year when I switched him to  carvedilol.  I think this was a misunderstanding.  Dr. Gala Romney also  started the patient on potassium 20 mEq a day.  Thought, the patient had  component of cardiorenal syndrome and talking with [name] today, he is  not taking any of his medicines yet this morning because he states it  can still make him feel tired and fatigued.  He is complaining of gout  flare up in his right foot and talking with him, reviewing his diet, he  states he had chicken livers to eat yesterday, which could be  contributing to the gout flare up and the fluid retention.   PAST MEDICAL HISTORY:  1. Congestive heart failure secondary diastolic dysfunction with EF      currently 50%.  2. 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  3. Hypertension.  4. Morbid  obesity.  5. Obstructive sleep apnea with partial compliance to CPAP.  6. Type 2 diabetes.  7. Dyslipidemia.  8. Gout.  9. Chronic renal insufficiency with creatinine 1.7-1.8.   REVIEW OF SYSTEMS:  As stated above, otherwise negative.   CURRENT MEDICATIONS:  1. Atacand 32 mg.  2. Allopurinol 100 mg b.i.d.  3. Aspirin 81 daily.  4. Glyburide/metformin 5/500 b.i.d.  5. Lasix 40 mg b.i.d.  6. Spironolactone 12.5 mg b.i.d.  7. Coreg 50 mg b.i.d.  8. Chlorthalidone 25 mg daily.  9. KCl 20 mEq daily.  10.Zocor 80 mg daily.  11.P.r.n. medicines include O2, CPAP, and Vicodin for gout.   PHYSICAL EXAMINATION:  VITAL SIGNS:  Weight 269 pounds, blood pressure  155/99.  No medicines taken at today.  Heart rate 95.  GENERAL:  [name] is in no acute distress.  NECK:  No signs of jugular vein distention at 45 degrees angle.  LUNGS:  Clear to auscultation.  CARDIOVASCULAR:  Reveals S1 and S2.  Regular rate and rhythm.  ABDOMEN:  Protuberant.  Positive  bowel sounds.  Nontender.  LOWER EXTREMITIES:  Has 2+ pitting edema bilaterally.  NEUROLOGIC:  Alert and oriented x3.   IMPRESSION:  Congestive heart failure secondary diastolic dysfunction  with an ejection fraction of 50%.  The patient has signs of fluid volume  overload in the setting of hypertension and dietary noncompliance, which  is ongoing.  Difficult to evaluate the patient as he usually does not  take his medications prior to coming to his appointments especially as  he is driving because he says they make him feel so weak.  I am going to  leave his medicines like they are for now and check his labs today, also  check a uric acid level.      [name], ACNP  Electronically Signed      Rollene Rotunda, MD, [facility]  Electronically Signed   MB/MedQ  DD: 04/26/2008  DT: 04/26/2008  Job #: [id number]   cc:   [name]

## 2010-12-17 NOTE — Discharge Summary (Signed)
[name], [name] NO.:  [id number]   MEDICAL RECORD NO.:  [id number]          PATIENT TYPE:  INP   LOCATION:  4705                         FACILITY:  [facility]   PHYSICIAN:  Everardo Beals. Juanda Chance, MD, FACCDATE OF BIRTH:  May 06, 1951   DATE OF ADMISSION:  03/04/2007  DATE OF DISCHARGE:                               DISCHARGE SUMMARY   PRIMARY CARDIOLOGIST:  [name]. [name], MD, congestive heart failure.   CARDIOLOGIST:  [name], MD   PRIMARY CARE PHYSICIAN:  Dr. Bradly Bienenstock.   PROCEDURES PERFORMED DURING HOSPITALIZATION:  CT of the abdomen and  pelvis with renal evaluation (results pending at the time of this  discharge).   DISCHARGE DIAGNOSES:  1. Uncontrolled hypertension, now normalized.  2. New onset heart failure, diastolic in nature.  3. Status post cardiac catheterization showing nonobstructive disease.   SECONDARY DIAGNOSES:  1. Morbid obesity.  2. Chronic renal insufficiency with a creatinine at the time of      admission 1.76.  3. Type 2 diabetes.  4. Obstructive sleep apnea with partial compliance to continuous      positive-airway pressure.  5. Dyslipidemia.   HOSPITAL COURSE:  [name] is a morbidity obese African-American male,  who was seen by Dr. Arvilla Meres in the congestive heart failure  clinic on March 04, 2007, was found to have poorly-controlled  hypertension and admitted for hypertensive urgency with poor tolerance  of his medication.  The patient's blood pressure on admission was found  to be 180/92 on multiple medications.  As a result of this, the patient  was admitted for continued medication adjustment and a need for  evaluation of renal arteries.   The patient was seen and examined by Dr. Arvilla Meres on day of  admission.  His note reads systolic blood pressures running 180 to 250  despite multiple meds.  The patient was started on Catapres 0.3 mg twice  a day, also on Capoten, Norvasc, labetalol 150 mg  t.i.d., and  spironolactone 12.5 mg b.i.d.  Blood pressure remained elevated at  171/107 on the following day, and labetalol was increased to 300 mg  t.i.d.  The patient did respond somewhat to medications, and blood  pressure had come down to 137/88 with a heart rate of 80.  The patient  continued on CPAP at home.  The patient was scheduled for a renal artery  study with CT abdomen and pelvis on the day of discharge with results  pending during time of this dictation.  The patient was given Mucomyst  prophylactically prior to the renal artery evaluation secondary to use  of contrast dye.  The patient's blood pressure normalized with 121/77 on  day of discharge.  The patient's captopril was held along with his Lasix  day of patient's CT scan, to be restarted on day of discharge.  The  patient was seen and examined by Dr. Charlies Constable on day of discharge  and found to be stable.  He will have care management evaluating for  assistance in his medications, as he has had trouble affording them  financially.  The patient is also going to have a followup with Dr.  Arvilla Meres soon after discharge with a BMET to be scheduled in 2  days.  The patient's Glucophage and glyburide will be held for 24 hours  along with Lasix to be held as well and will be started tomorrow, post  contrast dye.   DISCHARGE LABORATORY:  Sodium 140, potassium 3.7, chloride 105, CO2 of  30.  Glucose 120, BUN 14, creatinine 1.33.  Hemoglobin 13.3, hematocrit  42.3, white blood cells 9.7, and platelets 154.  Cholesterol 133,  triglycerides 150, HDL 39, LDL 64.   DISCHARGE MEDICATIONS:  1. Aspirin 81 mg daily.  2. Clonidine 0.3 mg twice a day.  3. Lasix 40 mg twice a day (start on August 5).  4. Norvasc 10 mg daily.  5. Spironolactone 12.5 mg b.i.d.  6. Labetalol 300 mg 3 times a day.  7. Simvastatin 40 mg at bedtime.  8. Glucophage/glyburide 5/500 mg twice a day to start on August 5.  9. Captopril 6.25 mg twice a  day.   ALLERGIES:  NO KNOWN DRUG ALLERGIES.   FOLLOWUP PLANS AND APPOINTMENTS:  1. The patient will follow up in 2 days at [facility] office      for a BMET to be drawn to evaluate renal insufficiency on August 5      at 3:30 p.m.  2. The patient is to follow up with Dr. Arvilla Meres on August 13      at 2:45 p.m. for continued medical management and congestive heart      failure management and evaluation of the blood pressure control and      also discussion of renal artery evaluation during hospitalization,      as results are not available at the time of discharge.   TIME SPENT WITH THE PATIENT TO INCLUDE PHYSICIAN TIME:  45 minutes.      Bettey Mare. Lyman Bishop, NP      Everardo Beals. Juanda Chance, MD, [facility]  Electronically Signed    KML/MEDQ  D:  03/08/2007  T:  03/08/2007  Job:  [id number]   cc:   [facility]

## 2010-12-17 NOTE — Cardiovascular Report (Signed)
NAMEJAIMEN, MELONE NO.:  [id number]   MEDICAL RECORD NO.:  [id number]          PATIENT TYPE:  INP   LOCATION:  4735                         FACILITY:  [facility]   PHYSICIAN:  [name]. [name], MD  DATE OF BIRTH:  July 01, 1951   DATE OF PROCEDURE:  01/27/2007  DATE OF DISCHARGE:                            CARDIAC CATHETERIZATION   PROCEDURE:  Left heart catheterization, right heart catheterization,  selective coronary angiography and StarClose of the right femoral  artery.   INDICATIONS:  Mr. Allison Quarry is a 60 year old gentleman who presented to  [facility] with congestive heart failure.  He has had some  symptoms of angina.  His echocardiogram, there, demonstrated a low  normal left ventricular systolic function with an EF of approximately  50%.  He was referred for right-and-left heart catheterization in the  setting of his new onset of heart failure; and to rule out ischemic  heart disease.   Risks and indications of the procedure were explained to the patient;  and informed consent was obtained.  Using a modified Seldinger technique  a 6-French femoral arterial sheath was placed and a 6-French femoral  venous sheath was placed on the right side.  Selective coronary  angiography was performed with preformed Judkins catheters.  An angled  pigtail catheter was inserted into the left ventricle and pressures were  recorded.  A left ventriculogram was deferred due to renal  insufficiency.  A pullback across the aortic valve was done.  Multipurpose end-hole catheter was used to perform the right heart  catheterization and pressures were recorded throughout the right heart  chambers.  Saturations were drawn from the central aorta, superior vena  cava, and pulmonary artery.  Cardiac output was calculated by the Fick  method.   FINDINGS:  Right atrial pressure is 15, right ventricular pressure is  44/17.  Pulmonary artery pressure is 46/23 with a mean of 35.   Pulmonary  capillary wedge pressure is 36.  Left ventricular pressure is 166/30.  Aortic pressure is 165/102 with a mean of 131.   Oxygen saturations pulmonary artery 69 superior vena cava 64, aorta 91.   Fick cardiac output 7.5 liters per minute.  Fick cardiac index 3.25  liters per minute per meter squared.   FINDINGS:  1. The left mainstem has minor luminal irregularities.  It trifurcates      into the LAD intermediate branch and left circumflex.  2. The LAD is a large-caliber vessel that courses down the LV apex.      There is no significant angiographic disease throughout the      proximal mid-or-distal portions of the LAD.  There is a medium-size      first diagonal branch arising from the proximal LAD that has a      tubular 70% long area of stenosis.  The lower diagonal branches      have no significant angiographic disease.  The LAD wraps around the      left ventricular apex supplying portions of the inferoapex.  3. The ramus intermedius is a medium-size vessel that has no  significant angiographic disease.  4. The left circumflex is large-caliber.  It provides a small first OM      and a large second OM.  The second OM branch has an ostial 40%      stenosis.  The circumflex courses down and supplies a large left      posterolateral branch that has no significant angiographic disease.      The midportion of the left circumflex has a 50% stenosis beyond the      second OM branch.  5. The right coronary artery is large caliber.  It is a dominant      vessel.  It is angiographically normal.  Distally it provides a PDA      branch as well as two posterolateral branches.  There is a medium-      sized RV marginal branch from the midportion.   ASSESSMENT:  1. Nonobstructive coronary artery disease.  2. Elevated left ventricular filling pressures (likely secondary to      hypertensive heart disease).   Mr. Allison Quarry will require medical therapy for his heart failure and  coronary  artery disease.  He does not require revascularization as he has minor  nonobstructive disease.  The only significant lesion from a hemodynamic  standpoint is likely the diagonal branch; and it does not warrant  revascularization, at this point.  He needs diuresis and aggressive  treatment of his hypertension.      [name]. [name], MD  Electronically Signed     MDC/MEDQ  D:  01/27/2007  T:  01/27/2007  Job:  [id number]   cc:   Learta Codding, MD,FACC

## 2010-12-17 NOTE — Assessment & Plan Note (Signed)
[facility]                          CHRONIC HEART FAILURE NOTE   NAME:[name], [name]                        MRN:          [id number]  DATE:03/04/2007                            DOB:          [birth year]    [name] returns today for follow-up of his congestive heart failure  which is diastolic in nature with EF currently 50% by cardiac  catheterization.  I recently saw [name] as a new patient to the heart  failure clinic.  His primary cardiologist is [name]. [name], MD.  Mr.  Ginsberg just recently underwent a right and left heart cath to rule out  ischemic disease in the setting of his new onset of heart failure.  Patient was found to have nonobstructive disease with elevated left  ventricular filling pressures, most likely secondary to hypertensive  heart disease.  When I saw Mr. [name] last, he complained of ongoing  dizziness and lightheadedness and presyncopal episodes.  He was at that  time taking labetalol.  Patient was actually taking 900 mg b.i.d. but  was suppose to be taking 600 mg b.i.d.  He was also taking clonidine 0.2  mg b.i.d. and Lasix 80 mg b.i.d.  He was not taking his BiDil as he  could not afford it and he had not refilled his Norvasc.  At that time,  I cut patient back to 600 mg b.i.d. on his labetalol.  I continued his  clonidine, continued his Lasix at 80 mg b.i.d.  Continued to hold his  Norvasc and clonidine.  I asked him to bring in his blood pressure cuff  from home to the visit today so that we could re-evaluate his blood  pressure, correlate difference between  pressure readings here and what  he is getting at home.  However, I received a telephone call from his  girlfriend a few days ago.  She stated his blood pressure was going back  up.  He was running 215/190.  I had him resume the labetalol at 900 mg  b.i.d.  He states he has been doing this since I talked with his  girlfriend, but he continues to feel poorly and  gets blood pressure  readings of 185/140 on his blood pressure cuff at home.   PAST MEDICAL HISTORY:  1. Hypertension, poor controlled on several medications.  2. New onset of heart failure, diastolic in nature.  3. Status post cardiac catheterization showing nonobstructive disease.  4. Morbid obesity.  5. Chest pain.  6. Chronic renal insufficiency. Creatinine at time of discharge from      [facility]. [facility] was 1.43.  Most recent blood work      obtained on February 03, 2007, creatinine 1.8 with BUN of 24.  7. Type 2 diabetes.  8. Obstructive sleep apnea with partial compliance to CPAP.  9. Dyslipidemia.   REVIEW OF SYSTEMS:  As stated above.   CURRENT MEDICATIONS:  1. Aspirin 81 mg daily.  2. Clonidine 0.2 b.i.d.  3. Lasix 80 mg b.i.d.  4. Labetalol 900 mg b.i.d.  5. Simvastatin 40 mg nightly.  6. K-Dur 40 mEq b.i.d.  7. Glyburide/metformin 5/500 mg b.i.d.   PHYSICAL EXAMINATION:  VITAL SIGNS:  Weight 273 pounds, blood pressure  180/118 manually with heart rate of 89.  GENERAL APPEARANCE:  [name] is in no acute distress.  NECK:  No jugular venous distension at 90 degree angle.  LUNGS:  Distant breath sounds throughout, otherwise clear to  auscultation.  CARDIOVASCULAR:  S1 and S2, no S3 or S4 heard at this time.  ABDOMEN:  Morbidly obese, positive bowel sounds.  EXTREMITIES:  Lower extremities without clubbing, cyanosis, or edema.  NEUROLOGIC:  Patient alert and oriented x3.   IMPRESSION:  Hypertensive urgency with poor tolerance to medication.  Difficult to treat patient's blood pressure outpatient.  Recommend that  patient be admitted for further treatment of blood pressure and to  monitor medication therapies/side effects/intolerances.  Patient also  with some renal insufficiency.  Creatinine has bumped from 1.4 to 1.8.  I obtained a renal ultrasound on patient recently that recommended CT  for improved quality.  Patient will need CT of the renals with  contrast,  however, for now, we will admit patient, get his blood pressure under  control, make some adjustments in medication use, hydralazine p.r.n.  and then plan on gently hydrating patient, premedicate with Mucomyst,  obtain CT with contrast of the renals, monitor renal function and then  discharge patient home when blood pressure is stable, patient tolerating  medications and CT with contrast has been obtained.  Also, will continue  patient's aspirin and Lasix.  Will start spironolactone, discontinue  p.o. potassium supplement, monitor CBGs.  I have asked for a dietary  consult regarding low sodium diabetic diet.  Will continue CPAP nightly.   ADDENDUM:  He is going to be admitted to the hospital.      [name], ACNP  Electronically Signed      [name], MD  Electronically Signed   MB/MedQ  DD: 03/04/2007  DT: 03/05/2007  Job #: [id number]   cc:   [name]

## 2010-12-17 NOTE — Assessment & Plan Note (Signed)
[facility]                            CARDIOLOGY OFFICE NOTE   NAME:[name], [name]                        MRN:          [id number]  DATE:08/28/2008                            DOB:          Jun 06, 1951    PRIMARY CARE PHYSICIAN:  None.   INTERVAL HISTORY:  [name] is a 60 year old male with a history of  diastolic heart failure with ejection fraction approximately 50%.  He  has nonobstructive coronary artery disease by catheterization in 2008.  He also has history of hypertension, obesity, diabetes, hyperlipidemia,  gout, chronic renal insufficiency with the baseline creatinine of 1.7-  1.8 and obstructive sleep apnea with only partial compliance to CPAP.   He has been followed by [name] in the Heart Failure Clinic.  She has been adjusting his diuretics.  He returns today for routine  followup.  Overall, he says he continues to feel quite fatigued and he  says anytime he tries to do much of any activity, he just gets worn-out.  He feels worse just after taking his medicines.  He feels weak and  drowsy for about 2-3 hours before he get up and do anything.  He has had  occasional chest tightness, but this is unchanged.  He does have chronic  dyspnea.  He denies any orthopnea or PND.  He is wearing his CPAP for  half evening, but not the other half.  He continues to struggle with  gout about once a week.  He is yet to find himself a primary care  physician.   CURRENT MEDICATIONS:  1. Norvasc 10 a day.  2. Simvastatin 80 a day.  3. Carvedilol 25 b.i.d.  4. Glyburide 5 b.i.d.  5. Metformin 500 b.i.d.  6. Atacand 32 a day.  7. Allopurinol 100 b.i.d.  8. Aspirin 81 a day.  9. Lasix 40 b.i.d.  10.Chlorthalidone 25 a day.  11.Potassium 20 a day.  12.Also CPAP and oxygen p.r.n.   PHYSICAL EXAMINATION:  GENERAL:  He is in no acute distress, ambulates  in the clinic without any respiratory difficulty.  VITAL SIGNS:  Blood pressure is 104/78,  heart rate is 92, weights 273,  which is stable.  HEENT:  Notable for injected conjunctivae bilaterally.  Otherwise  normal.  NECK:  Supple and thick.  JVP appears flat, but it is hard to assess  given his girth.  Carotids are 2+ bilaterally without any bruits.  There  is no lymphadenopathy or thyromegaly.  CARDIAC:  PMI is nonpalpable.  He is regular with S4.  No obvious  murmurs.  LUNGS:  Clear.  ABDOMEN:  Markedly obese, nontender, and nondistended.  No obvious  hepatosplenomegaly.  No bruits.  No masses.  Good bowel sounds.  EXTREMITIES:  Warm.  No cyanosis or clubbing.  There is trace edema  bilaterally.  No rash.  NEURO:  Alert and oriented x3.  Cranial nerves II through XII are  intact.  Moves all 4 extremities without difficulty.  Affect is  pleasant.   EKG shows normal sinus rhythm at a rate 92 with mildly prolonged QT  interval with a QTc of 489 milliseconds.   ASSESSMENT AND PLAN:  1. Dyspnea and fatigue.  I suspect this is multifactorial.  His weight      is likely playing a big role.  I once again encouraged him to try      and lose some weight.  Also to be more compliant with his CPAP.  2. Congestive heart failure secondary to diastolic dysfunction.      Volume status looks good.  We will get labs today to make sure he      is not over diuresed.  3. Hypertension.  Blood pressure looks great.  Given his symptoms, I      am wondering if his blood pressure may be too low at times.  We      will check a 48-hour blood pressure monitor.  4. Coronary artery disease.  This is mild nonobstructive disease.      Continue risk factor modification.  5. Gout.  We will increase his allopurinol 200 morning and 100 at      night.   DISPOSITION:  Pending the results of his blood work and ambulatory  monitor.  I have once again advised him strongly to find a primary care  physician to help with his multitude of problems.     Eddie Buckles. Bensimhon, MD  Electronically Signed     DRB/MedQ  DD: 08/28/2008  DT: 08/29/2008  Job #: [id number]

## 2010-12-17 NOTE — Assessment & Plan Note (Signed)
[facility]                          CHRONIC HEART FAILURE NOTE   NAME:COBBSManpreet, Strey                        MRN:          [id number]  DATE:03/17/2007                            DOB:          06-17-1951    Mr. Crutchfield returns today for followup of his congestive heart failure  which is secondary to diastolic dysfunction with a history of poorly  controlled hypertension.  I just saw Mr. Duchene here on March 04, 2007, at  which time he was found to be in hypertensive urgency with poor  tolerance to medication.  I reviewed his situation with Dr. Gala Romney  who agreed that we should admit the patient for further medication  titration and management of his hypertension.  There is also a financial  competent to Mr. Zook reluctance to take medications, as he just  basically cannot afford several of his medications.  He has done well  since he was discharged home.  I saw him back here in the lab last week,  once again could not afford to have some of his prescriptions filled.  In supplying him with samples, I was able to stop his Norvasc and  Simvastatin and start him on Caduet and I held his captopril and started  him on Atacand, supplying him with a month of samples.  He returns today  for followup.  He states he is having ongoing intermittent dizziness if  he changes position too quickly, although he has been compliant with his  medication.  He also complains of decreased energy level but states that  he knows this is what is best for him at this time as his body adjusts  to his more controlled blood pressure readings.  He denies any  orthopnea, PND.  He states he does not sleep well but contributes this  to taking his fluid pill twice a day.  I urged him to take his evening  dose in the afternoon.   PAST MEDICAL HISTORY:  1. Heart failure secondary to diastolic dysfunction with an ejection      fraction currently 50% by cardiac catheterization in June  2008.  2. Poorly controlled hypertension.  3. Status post cardiac catheterization showing nonobstructive disease.  4. Morbid obesity.  5. Atypical chest pain.  6. Chronic renal insufficiency with creatinine 1.7 to 1.8.  7. Type 2 diabetes.  8. Obstructive sleep apnea with partial compliance to CPAP.  9. Dyslipidemia.   REVIEW OF SYSTEMS:  As stated above.   CURRENT MEDICATIONS:  1. Aspirin 81 mg daily.  2. Clonidine 0.3 mg b.i.d.  3. Spironolactone 12.5 mg b.i.d.  4. Lasix 40 mg two tablets b.i.d.  5. Glyburide/Metformin 5/500 b.i.d.  6. Labetalol 300 mg t.i.d.  7. Caduet 10/40 mg daily.  8. Atacand 16 mg daily.   PHYSICAL EXAMINATION:  VITAL SIGNS:  Weight 269 pounds, weight is down 4  pounds, blood pressure 156/98, manual 130/90, heart rate 82.  GENERAL:  Mr. Arterberry is in no acute distress.  NECK:  No jugular venous distention noted at 45 degrees angle.  LUNGS:  Clear to auscultation.  CARDIOVASCULAR:  Reveals an S1 and S2, regular rate and rhythm.  ABDOMEN:  Soft, nontender.  Positive bowel sounds.  Morbidly obese.  LOWER EXTREMITIES:  Without clubbing or cyanosis.  The patient has a  trace of edema bilateral lower extremities.  NEUROLOGIC:  Alert and oriented x3.  SKIN:  Warm and dry.   IMPRESSION:  Diastolic heart failure with minimal signs of fluid  retention at this time.   We will continue current medications, repeat lab work today.  As the  patient is being titrated up on medications for blood pressure control  with known history of renal insufficiency.  Continue to encourage the  patient to exercise and tolerate side effects of medication as his blood  pressure improves.  I will give him another month's supply of samples of  Caduet and Atacand to help him out and continue the spironolactone,  labetalol, clonidine, and Lasix at current dose pending results of lab  work.  We will touch base with the patient at home and see him back in  one month.      [name], ACNP  Electronically Signed      [name], MD  Electronically Signed   MB/MedQ  DD: 03/17/2007  DT: 03/18/2007  Job #: [id number]

## 2010-12-26 ENCOUNTER — Encounter: Payer: Self-pay | Admitting: Internal Medicine

## 2011-01-01 ENCOUNTER — Ambulatory Visit (INDEPENDENT_AMBULATORY_CARE_PROVIDER_SITE_OTHER): Payer: Self-pay | Admitting: Internal Medicine

## 2011-01-01 ENCOUNTER — Encounter: Payer: Self-pay | Admitting: Internal Medicine

## 2011-01-01 VITALS — BP 150/99 | HR 105 | Resp 18 | Ht 66.0 in | Wt 269.8 lb

## 2011-01-01 DIAGNOSIS — I5032 Chronic diastolic (congestive) heart failure: Secondary | ICD-10-CM

## 2011-01-01 DIAGNOSIS — M109 Gout, unspecified: Secondary | ICD-10-CM

## 2011-01-01 DIAGNOSIS — I5022 Chronic systolic (congestive) heart failure: Secondary | ICD-10-CM

## 2011-01-01 DIAGNOSIS — I509 Heart failure, unspecified: Secondary | ICD-10-CM

## 2011-01-01 DIAGNOSIS — I10 Essential (primary) hypertension: Secondary | ICD-10-CM

## 2011-01-01 MED ORDER — PREDNISONE 10 MG PO TABS: ORAL_TABLET | ORAL | Status: DC

## 2011-01-01 NOTE — Progress Notes (Signed)
PCP: [name]  HPI:  [name] is a 60 year old male with a history of diastolic heart failure with ejection fraction approximately 50%.  He  has nonobstructive coronary artery disease by catheterization in 2008. Myoview 8/10 47% perfusion normal.  He also has history of severe hypertension, morbid obesity, diabetes, hyperlipidemia,  gout, chronic renal insufficiency with the baseline creatinine of 1.7-  1.8 and obstructive sleep apnea with only partial compliance to CPAP.   Returns today for routine f/u.   Says he has been doing well from HF perspective. Breathing better. Volume status well controlled. BP much improved 115-120/70-80s. (hasn't taken meds today)  Can walk a godd distance but over past week markedly limited by pain in hips, ankles and feet. Feels as it is gout. Was seen in ER and given shot in hip which helped but other joints still sore. Wearing CPAP at least  4 hours per night.  No palpitations. No orthopnea/pnd. No problems with medicines.   ROS: All systems negative except as listed in HPI, PMH and Problem List.  Past Medical History  Diagnosis Date  . Congestive heart failure     Secondary to diastolic dysfunction with an EF 50%  . CAD (coronary artery disease) 2008    nonobstructive, confirmed by cath in 2008; adenosine/ myoview 8/10: EF 47% normal perfusion  . Hypertension   . Morbid obesity   . Sleep apnea, obstructive     Partial compliance to CPAP  . Diabetes mellitus type 2 in obese   . Dyslipidemia   . Gout   . Chronic renal insufficiency     Creatine 1.7-1.8    Current Outpatient Prescriptions  Medication Sig Dispense Refill  . allopurinol (ZYLOPRIM) 100 MG tablet Take 100 mg by mouth 2 (two) times daily.        Marland Kitchen aspirin 81 MG tablet Take 81 mg by mouth daily.        . bumetanide (BUMEX) 1 MG tablet Take 1 mg by mouth daily. Two times a day on Mondays and Fridays       . carvedilol (COREG) 12.5 MG tablet Take 12.5 mg by mouth 2 (two) times daily with  a meal.        . glipiZIDE (GLUCOTROL) 10 MG tablet Take 10 mg by mouth daily.        Marland Kitchen HYDROcodone-acetaminophen (VICODIN) 5-500 MG per tablet Take 1 tablet by mouth as needed. For gout.       . indomethacin (INDOCIN) 50 MG capsule Take 50 mg by mouth as needed.        . insulin NPH (NOVOLIN N) 100 UNIT/ML injection Inject 1 Units into the skin as directed.        . potassium chloride SA (K-DUR,KLOR-CON) 20 MEQ tablet Take 20 mEq by mouth 2 (two) times daily.        . sitaGLIPtan (JANUVIA) 100 MG tablet Take 100 mg by mouth daily.        Marland Kitchen spironolactone (ALDACTONE) 25 MG tablet Take 25 mg by mouth daily.        Marland Kitchen DISCONTD: carvedilol (COREG) 25 MG tablet Take 25 mg by mouth 2 (two) times daily with a meal.        . DISCONTD: rosuvastatin (CRESTOR) 40 MG tablet Take 40 mg by mouth daily.           PHYSICAL EXAM: Filed Vitals:   01/01/11 1206  BP: 150/99  Pulse: 105  Resp: 18   General:  Obese Well  appearing. No resp difficulty HEENT: normal Neck: supple. Thick unable to accurately assess JVP flat. Carotids 2+ bilaterally; no bruits. No lymphadenopathy or thryomegaly appreciated. Cor: PMI normal. Regular rate & rhythm. No rubs, murmurs. + s4 Lungs: clear Abdomen: obese soft, nontender, nondistended. Unable to assess for hepatosplenomegaly. No bruits or masses. Good bowel sounds. Extremities: no cyanosis, clubbing, rash, tr edema. R ankle and 1st MTP joint tender and war, Neuro: alert & orientedx3, cranial nerves grossly intact. Moves all 4 extremities w/o difficulty. Affect pleasant.    ECG: STach 106 No ST-T wave abnormalities.     ASSESSMENT & PLAN:

## 2011-01-01 NOTE — Assessment & Plan Note (Signed)
Stable. Volume status looks ok. Continue current regimen. Needs to lose weight.

## 2011-01-01 NOTE — Patient Instructions (Signed)
Prednisone 10 mg take 4 tabs daily for 3 days Your physician wants you to follow-up in: 6 months.  You will receive a reminder letter in the mail two months in advance. If you don't receive a letter, please call our office to schedule the follow-up appointment.

## 2011-01-01 NOTE — Assessment & Plan Note (Signed)
I suspect he is having a gout flare. Given DM2 and HF would like to limit prednisone exposure will give 40 mg qd x 3 days as a burst. Can f/u with PCP.

## 2011-01-01 NOTE — Assessment & Plan Note (Signed)
BP well controlled at home but elevated here in setting of not taking meds this am. Continue current regimen.

## 2011-01-03 ENCOUNTER — Other Ambulatory Visit: Payer: Self-pay | Admitting: Internal Medicine

## 2011-02-04 ENCOUNTER — Other Ambulatory Visit: Payer: Self-pay | Admitting: Internal Medicine

## 2011-05-19 LAB — BASIC METABOLIC PANEL
BUN: 14
Calcium: 9.3
Calcium: 9.4
Calcium: 9.6
Chloride: 105
Creatinine, Ser: 1.33
Creatinine, Ser: 1.45
GFR calc Af Amer: 59 — ABNORMAL LOW
GFR calc Af Amer: >60
GFR calc non Af Amer: 48 — ABNORMAL LOW
Potassium: 3.4 — ABNORMAL LOW
Sodium: 138
Sodium: 138

## 2011-05-19 LAB — COMPREHENSIVE METABOLIC PANEL
ALT: 13
AST: 17
Alkaline Phosphatase: 63
CO2: 32
Chloride: 103
GFR calc Af Amer: 49 — ABNORMAL LOW
GFR calc non Af Amer: 41 — ABNORMAL LOW
Sodium: 143
Total Bilirubin: 0.4

## 2011-05-19 LAB — LIPID PANEL
HDL: 39 — ABNORMAL LOW
Triglycerides: 150 — ABNORMAL HIGH
VLDL: 30

## 2011-05-19 LAB — DIFFERENTIAL
Basophils Relative: 0
Eosinophils Absolute: 0.1
Lymphs Abs: 1.5
Neutrophils Relative %: 77

## 2011-05-19 LAB — CBC
HCT: 39.4
Hemoglobin: 12.8 — ABNORMAL LOW
MCHC: 32.5
MCV: 81.6
MCV: 82.3
MCV: 82.1
Platelets: 152
Platelets: 154
RBC: 4.83
RBC: 5.01
RDW: 15.8 — ABNORMAL HIGH
WBC: 9.6
WBC: 9.7
WBC: 9

## 2011-05-21 LAB — BASIC METABOLIC PANEL
BUN: 16
BUN: 17
CO2: 37 — ABNORMAL HIGH
CO2: 38 — ABNORMAL HIGH
Calcium: 9.1
Calcium: 9.2
Calcium: 9.5
Chloride: 100
Chloride: 102
Creatinine, Ser: 1.45
Creatinine, Ser: 1.54 — ABNORMAL HIGH
Creatinine, Ser: 1.54 — ABNORMAL HIGH
GFR calc Af Amer: 57 — ABNORMAL LOW
GFR calc Af Amer: >60
GFR calc non Af Amer: 47 — ABNORMAL LOW
GFR calc non Af Amer: 52 — ABNORMAL LOW
Glucose, Bld: 111 — ABNORMAL HIGH
Glucose, Bld: 124 — ABNORMAL HIGH
Potassium: 3.7
Sodium: 142
Sodium: 143

## 2011-05-21 LAB — LIPID PANEL
Cholesterol: 180
HDL: 43
HDL: 46
LDL Cholesterol: 122 — ABNORMAL HIGH
Triglycerides: 73
VLDL: 19

## 2011-05-21 LAB — POCT I-STAT 3, VENOUS BLOOD GAS (G3P V)
Bicarbonate: 36.1 — ABNORMAL HIGH
Operator id: 284701
Operator id: 284701
TCO2: 39
pCO2, Ven: 77.5
pH, Ven: 7.262
pH, Ven: 7.285

## 2011-05-21 LAB — TSH: TSH: 3.596

## 2011-05-21 LAB — APTT: aPTT: 42 — ABNORMAL HIGH

## 2011-05-21 LAB — CBC
HCT: 36.6 — ABNORMAL LOW
Hemoglobin: 11.9 — ABNORMAL LOW
MCHC: 32
MCV: 82.5
MCV: 82.7
Platelets: 140 — ABNORMAL LOW
RBC: 4.41
RDW: 16.8 — ABNORMAL HIGH
RDW: 17 — ABNORMAL HIGH
WBC: 7.4

## 2011-05-21 LAB — POCT I-STAT 3, ART BLOOD GAS (G3+)
Acid-Base Excess: 7 — ABNORMAL HIGH
Operator id: 284701
pO2, Arterial: 69 — ABNORMAL LOW

## 2011-05-21 LAB — HEPATIC FUNCTION PANEL
Albumin: 2.9 — ABNORMAL LOW
Indirect Bilirubin: 0.3
Total Protein: 5.9 — ABNORMAL LOW

## 2011-05-21 LAB — PROTIME-INR: Prothrombin Time: 13.8

## 2011-05-21 LAB — B-NATRIURETIC PEPTIDE (CONVERTED LAB): Pro B Natriuretic peptide (BNP): 79

## 2011-08-26 ENCOUNTER — Other Ambulatory Visit: Payer: Self-pay | Admitting: Internal Medicine

## 2012-03-20 ENCOUNTER — Other Ambulatory Visit: Payer: Self-pay | Admitting: Internal Medicine

## 2012-12-27 ENCOUNTER — Other Ambulatory Visit: Payer: Self-pay | Admitting: Internal Medicine

## 2013-03-29 ENCOUNTER — Other Ambulatory Visit: Payer: Self-pay

## 2013-03-29 MED ORDER — SPIRONOLACTONE 25 MG PO TABS: ORAL_TABLET | ORAL | Status: DC

## 2013-12-15 ENCOUNTER — Emergency Department (HOSPITAL_COMMUNITY): Payer: Medicaid - Out of State

## 2013-12-15 ENCOUNTER — Emergency Department (HOSPITAL_COMMUNITY)
Admission: EM | Admit: 2013-12-15 | Discharge: 2013-12-15 | Disposition: A | Discharge status: Home / Self Care | Payer: Medicaid - Out of State | Attending: Emergency Medicine | Admitting: Emergency Medicine

## 2013-12-15 ENCOUNTER — Encounter (HOSPITAL_COMMUNITY): Payer: Self-pay | Admitting: Emergency Medicine

## 2013-12-15 DIAGNOSIS — M109 Gout, unspecified: Secondary | ICD-10-CM | POA: Insufficient documentation

## 2013-12-15 DIAGNOSIS — E119 Type 2 diabetes mellitus without complications: Secondary | ICD-10-CM | POA: Insufficient documentation

## 2013-12-15 DIAGNOSIS — Z794 Long term (current) use of insulin: Secondary | ICD-10-CM | POA: Insufficient documentation

## 2013-12-15 DIAGNOSIS — IMO0002 Reserved for concepts with insufficient information to code with codable children: Secondary | ICD-10-CM | POA: Insufficient documentation

## 2013-12-15 DIAGNOSIS — R61 Generalized hyperhidrosis: Secondary | ICD-10-CM | POA: Insufficient documentation

## 2013-12-15 DIAGNOSIS — G4733 Obstructive sleep apnea (adult) (pediatric): Secondary | ICD-10-CM | POA: Insufficient documentation

## 2013-12-15 DIAGNOSIS — R0602 Shortness of breath: Secondary | ICD-10-CM

## 2013-12-15 DIAGNOSIS — I2510 Atherosclerotic heart disease of native coronary artery without angina pectoris: Secondary | ICD-10-CM | POA: Insufficient documentation

## 2013-12-15 DIAGNOSIS — Z791 Long term (current) use of non-steroidal anti-inflammatories (NSAID): Secondary | ICD-10-CM | POA: Insufficient documentation

## 2013-12-15 DIAGNOSIS — R05 Cough: Secondary | ICD-10-CM | POA: Insufficient documentation

## 2013-12-15 DIAGNOSIS — I129 Hypertensive chronic kidney disease with stage 1 through stage 4 chronic kidney disease, or unspecified chronic kidney disease: Secondary | ICD-10-CM | POA: Insufficient documentation

## 2013-12-15 DIAGNOSIS — R079 Chest pain, unspecified: Secondary | ICD-10-CM | POA: Insufficient documentation

## 2013-12-15 DIAGNOSIS — I509 Heart failure, unspecified: Secondary | ICD-10-CM | POA: Insufficient documentation

## 2013-12-15 DIAGNOSIS — N189 Chronic kidney disease, unspecified: Secondary | ICD-10-CM | POA: Insufficient documentation

## 2013-12-15 DIAGNOSIS — R059 Cough, unspecified: Secondary | ICD-10-CM | POA: Insufficient documentation

## 2013-12-15 DIAGNOSIS — Z8669 Personal history of other diseases of the nervous system and sense organs: Secondary | ICD-10-CM | POA: Insufficient documentation

## 2013-12-15 DIAGNOSIS — E162 Hypoglycemia, unspecified: Secondary | ICD-10-CM

## 2013-12-15 LAB — I-STAT CHEM 8, ED
BUN: 17 mg/dL (ref 6–23)
CHLORIDE: 96 meq/L (ref 96–112)
Calcium, Ion: 1.21 mmol/L (ref 1.13–1.30)
Creatinine, Ser: 1.5 mg/dL — ABNORMAL HIGH (ref 0.50–1.35)
GLUCOSE: 123 mg/dL — AB (ref 70–99)
HEMATOCRIT: 58 % — AB (ref 39.0–52.0)
HEMOGLOBIN: 19.7 g/dL — AB (ref 13.0–17.0)
Potassium: 4.2 mEq/L (ref 3.7–5.3)
SODIUM: 142 meq/L (ref 137–147)
TCO2: 40 mmol/L (ref 0–100)

## 2013-12-15 LAB — URINALYSIS, ROUTINE W REFLEX MICROSCOPIC
BILIRUBIN URINE: NEGATIVE
GLUCOSE, UA: NEGATIVE mg/dL
Ketones, ur: NEGATIVE mg/dL
Nitrite: NEGATIVE
Protein, ur: 100 mg/dL — AB
SPECIFIC GRAVITY, URINE: 1.011 (ref 1.005–1.030)
UROBILINOGEN UA: 0.2 mg/dL (ref 0.0–1.0)
pH: 5.5 (ref 5.0–8.0)

## 2013-12-15 LAB — CBC
HEMATOCRIT: 53.8 % — AB (ref 39.0–52.0)
HEMOGLOBIN: 16.4 g/dL (ref 13.0–17.0)
MCH: 27.4 pg (ref 26.0–34.0)
MCHC: 30.5 g/dL (ref 30.0–36.0)
MCV: 89.8 fL (ref 78.0–100.0)
Platelets: 119 10*3/uL — ABNORMAL LOW (ref 150–400)
RBC: 5.99 MIL/uL — ABNORMAL HIGH (ref 4.22–5.81)
RDW: 17.1 % — AB (ref 11.5–15.5)
WBC: 6.5 10*3/uL (ref 4.0–10.5)

## 2013-12-15 LAB — CBG MONITORING, ED
Glucose-Capillary: 51 mg/dL — ABNORMAL LOW (ref 70–99)
Glucose-Capillary: 179 mg/dL — ABNORMAL HIGH (ref 70–99)

## 2013-12-15 LAB — COMPREHENSIVE METABOLIC PANEL
ALK PHOS: 70 U/L (ref 39–117)
ALT: 8 U/L (ref 0–53)
AST: 19 U/L (ref 0–37)
Albumin: 3.5 g/dL (ref 3.5–5.2)
BUN: 16 mg/dL (ref 6–23)
CHLORIDE: 97 meq/L (ref 96–112)
CO2: 36 meq/L — AB (ref 19–32)
Calcium: 9.5 mg/dL (ref 8.4–10.5)
Creatinine, Ser: 1.38 mg/dL — ABNORMAL HIGH (ref 0.50–1.35)
GFR calc Af Amer: 62 mL/min — ABNORMAL LOW (ref >90)
GFR, EST NON AFRICAN AMERICAN: 54 mL/min — AB (ref >90)
Glucose, Bld: 127 mg/dL — ABNORMAL HIGH (ref 70–99)
POTASSIUM: 4.5 meq/L (ref 3.7–5.3)
Sodium: 141 mEq/L (ref 137–147)
Total Protein: 7.3 g/dL (ref 6.0–8.3)

## 2013-12-15 LAB — I-STAT TROPONIN, ED: Troponin i, poc: 0 ng/mL (ref 0.00–0.08)

## 2013-12-15 LAB — URINE MICROSCOPIC-ADD ON

## 2013-12-15 LAB — D-DIMER, QUANTITATIVE (NOT AT ARMC): D-Dimer, Quant: 0.35 ug/mL-FEU (ref 0.00–0.48)

## 2013-12-15 LAB — PRO B NATRIURETIC PEPTIDE: Pro B Natriuretic peptide (BNP): 287.4 pg/mL — ABNORMAL HIGH (ref 0–125)

## 2013-12-15 LAB — TROPONIN I: Troponin I: <0.3 ng/mL (ref <0.30)

## 2013-12-15 MED ORDER — DEXTROSE 50 % IV SOLN
25.0000 g | Freq: Once | INTRAVENOUS | Status: AC
  Administered 2013-12-15: 25 g via INTRAVENOUS
  Filled 2013-12-15: qty 50

## 2013-12-15 MED ORDER — DEXTROSE 5 % IV BOLUS: 500.0000 mL | Freq: Once | INTRAVENOUS | Status: DC

## 2013-12-15 NOTE — ED Notes (Signed)
The patient is reporting SOB, dizziness, weakness, nausea but no vomiting.  he does report diaphoresis.  The patient said he has had the chest pain for about two weeks and every time he takes the aspirin it goes away.  The patient came to the ED because he could not "catch" his breath.  He did report having an appointment on the 6th of June however he says he cannot wait until then.

## 2013-12-15 NOTE — ED Notes (Signed)
Per Gershon Mussel'Malley, [place] pt is to receive bag lunch, pt diaphoretic, states, "My sugar has went to low." [place], PA aware, pt O2 sat 88% on RA, pt placed on 2 L [place]

## 2013-12-15 NOTE — ED Provider Notes (Signed)
CSN: [id number]     Arrival date & time 12/15/13  1555 History   First MD Initiated Contact with Patient 12/15/13 1743     Chief Complaint  Patient presents with  . Chest Pain    patient is reporting SOB, dizziness, weakness, nausea but no vomiting.  he does report diaphoresis.      (Consider location/radiation/quality/duration/timing/severity/associated sxs/prior Treatment) HPI Patient is a morbidly obese 63 year old male with history of CHF with an EF of 50%, CAD, HTN, OSA, IDDM, dyslipidemia and chronic renal insufficiency c/o left sided intermittent chest pain for several weeks, associated with SOB.  Patient also complaining dizziness generalized weakness and nausea but no vomiting. Does report intermittent episodes of recent and states chest pain is improved with aspirin. States he took two 81 mg tablets this morning and did help with pain within several minutes.  States he came to ED this evening because he could not "catch" his breath.  Pt states he has an appointment scheduled for June 6th with his cardiologist but could not wait that long. Reports he had similar pain in 2004/01/01 and "almost died" because he was having a heart attack and kept pointing off going to the doctor.  Pt is accompanied by a family member that encouraged him to come in tonight.  Reports intermittent cough but states that is normal for him. Denies fever or congestion. Denies vomiting. Denies weight gain or leg swelling, does report bilateral leg pain that is aching and sore, worse with ambulation. Reports having "slow blood flow" in his legs and was "put on a medication for it," however there are no blood thinners listed on pt's medication list.  Denies hx of DVT or PE. Denies hx of COPD or asthma. Denies sick contacts or recent travel.    Past Medical History  Diagnosis Date  . Congestive heart failure     Secondary to diastolic dysfunction with an EF 50%  . CAD (coronary artery disease) 2007-01-01    nonobstructive,  confirmed by cath in 01-Jan-2007; adenosine/ myoview 8/10: EF 47% normal perfusion  . Hypertension   . Morbid obesity   . Sleep apnea, obstructive     Partial compliance to CPAP  . Diabetes mellitus type 2 in obese   . Dyslipidemia   . Gout   . Chronic renal insufficiency     Creatine 1.7-1.8   History reviewed. No pertinent past surgical history. Family History  Problem Relation Age of Onset  . Cancer Other   . Diabetes Other   . Hyperlipidemia Other   . Hypertension Other    History  Substance Use Topics  . Smoking status: Never Smoker   . Smokeless tobacco: Never Used  . Alcohol Use: No    Review of Systems  Constitutional: Positive for diaphoresis and fatigue. Negative for fever and chills.  HENT: Negative for congestion.   Respiratory: Positive for cough and shortness of breath. Negative for wheezing and stridor.   Cardiovascular: Positive for chest pain. Negative for palpitations and leg swelling.  Gastrointestinal: Positive for nausea. Negative for vomiting, abdominal pain, diarrhea and constipation.  All other systems reviewed and are negative.     Allergies  Fish-derived products  Home Medications   Prior to Admission medications   Medication Sig Start Date End Date Taking? Authorizing Provider  albuterol (PROVENTIL HFA;VENTOLIN HFA) 108 (90 BASE) MCG/ACT inhaler Inhale 1-2 puffs into the lungs every 6 (six) hours as needed for wheezing or shortness of breath.   Yes Historical Provider, MD  amLODipine (NORVASC) 5 MG tablet Take 5 mg by mouth daily.   Yes Historical Provider, MD  aspirin 81 MG tablet Take 81 mg by mouth daily.     Yes Historical Provider, MD  insulin NPH (NOVOLIN N) 100 UNIT/ML injection Inject 45 Units into the skin 2 (two) times daily.    Yes Historical Provider, MD  allopurinol (ZYLOPRIM) 100 MG tablet Take 100 mg by mouth 2 (two) times daily.      Historical Provider, MD  bumetanide (BUMEX) 1 MG tablet TAKE ONE TABLET BY MOUTH TWICE DAILY ON  MONDAYS AND FRIDAYS AND ONE TABLET EVERY DAY ON ALL OTHER DAYS 03/20/12   Dolores Pattyaniel R Bensimhon, MD  carvedilol (COREG) 12.5 MG tablet Take 12.5 mg by mouth 2 (two) times daily with a meal.      Historical Provider, MD  glipiZIDE (GLUCOTROL) 10 MG tablet Take 10 mg by mouth daily.      Historical Provider, MD  HYDROcodone-acetaminophen (VICODIN) 5-500 MG per tablet Take 1 tablet by mouth as needed. For gout.     Historical Provider, MD  indomethacin (INDOCIN) 50 MG capsule Take 50 mg by mouth as needed.      Historical Provider, MD  potassium chloride SA (K-DUR,KLOR-CON) 20 MEQ tablet Take 20 mEq by mouth 2 (two) times daily.      Historical Provider, MD  predniSONE (DELTASONE) 10 MG tablet Take 4 tabs daily for 3 days 01/01/11   Dolores Pattyaniel R Bensimhon, MD  sitaGLIPtan (JANUVIA) 100 MG tablet Take 100 mg by mouth daily.      Historical Provider, MD  spironolactone (ALDACTONE) 25 MG tablet TAKE ONE TABLET BY MOUTH EVERY DAY 03/29/13   Dolores Pattyaniel R Bensimhon, MD   BP 117/86  Pulse 77  Temp(Src) 98.6 F (37 C) (Oral)  Resp 27  Ht 5\' 6"  (1.676 m)  Wt 266 lb (120.657 kg)  BMI 42.95 kg/m2  SpO2 96% Physical Exam  Nursing note and vitals reviewed. Constitutional: He is oriented to person, place, and time. He appears well-developed and well-nourished. No distress.  Morbidly obese male lying in exam bed. Mild SOB  HENT:  Head: Normocephalic and atraumatic.  Eyes: Conjunctivae and EOM are normal. Pupils are equal, round, and reactive to light. No scleral icterus.  Neck: Normal range of motion.  Cardiovascular: Normal rate, regular rhythm and normal heart sounds.   Pulmonary/Chest: Effort normal and breath sounds normal. No respiratory distress. He has no wheezes. He has no rales. He exhibits no tenderness.  Pt speaking in short sentences, no accessory muscle use. Lungs: CTAB with distant lung sounds in lower lobes.   Abdominal: Soft. Bowel sounds are normal. He exhibits no distension and no mass. There is no  tenderness. There is no rebound and no guarding.  Musculoskeletal: Normal range of motion.  Neurological: He is alert and oriented to person, place, and time.  Skin: Skin is warm and dry. He is not diaphoretic.    ED Course  Procedures (including critical care time) Labs Review Labs Reviewed  CBC - Abnormal; Notable for the following:    RBC 5.99 (*)    HCT 53.8 (*)    RDW 17.1 (*)    Platelets 119 (*)    All other components within normal limits  COMPREHENSIVE METABOLIC PANEL - Abnormal; Notable for the following:    CO2 36 (*)    Glucose, Bld 127 (*)    Creatinine, Ser 1.38 (*)    Total Bilirubin <0.2 (*)    GFR  calc non Af Amer 54 (*)    GFR calc Af Amer 62 (*)    All other components within normal limits  PRO B NATRIURETIC PEPTIDE - Abnormal; Notable for the following:    Pro B Natriuretic peptide (BNP) 287.4 (*)    All other components within normal limits  URINALYSIS, ROUTINE W REFLEX MICROSCOPIC - Abnormal; Notable for the following:    Hgb urine dipstick TRACE (*)    Protein, ur 100 (*)    Leukocytes, UA TRACE (*)    All other components within normal limits  URINE MICROSCOPIC-ADD ON - Abnormal; Notable for the following:    Casts HYALINE CASTS (*)    All other components within normal limits  I-STAT CHEM 8, ED - Abnormal; Notable for the following:    Creatinine, Ser 1.50 (*)    Glucose, Bld 123 (*)    Hemoglobin 19.7 (*)    HCT 58.0 (*)    All other components within normal limits  CBG MONITORING, ED - Abnormal; Notable for the following:    Glucose-Capillary 51 (*)    All other components within normal limits  CBG MONITORING, ED - Abnormal; Notable for the following:    Glucose-Capillary 179 (*)    All other components within normal limits  D-DIMER, QUANTITATIVE  TROPONIN I  Rosezena SensorI-STAT TROPOININ, ED    Imaging Review Dg Chest 2 View  12/15/2013   CLINICAL DATA:  CHEST PAIN  EXAM: CHEST  2 VIEW  COMPARISON:  DG CHEST 2V dated 08/16/2013  FINDINGS: Low lung  volumes. Cardiac silhouette is enlarged. The lungs are clear. No acute osseous abnormalities.  IMPRESSION: No active cardiopulmonary disease.   Electronically Signed   By: Salome HolmesHector  Cooper M.D.   On: 12/15/2013 17:55     EKG Interpretation None      MDM   Final diagnoses:  Chest pain  SOB (shortness of breath)  Hypoglycemia    Pt with hx of CAD and CHF c/o intermittent chest pain resolved with aspirin over last 2 weeks, associated with SOB and dizziness.  Pt mildly SOB upon initial exam, O2-92% on RA.  While waiting for labs for cardiac workup, including D-dimer and repeat troponin pt reported "my sugar went log" at 18:36, CBG was initially 127 around 4PM in ED, recheck at 1900: 51.  Will start pt on D50% 25g  as well as give pt bagged lunch.    CBG improved to 179.  Troponin x2: negative for elevation. D-dimer: negative. BNP: unremarkable.  8:41 PM Consutled with cardiology, pt may be discharged home and schedule f/u with cardiologist, Dr. Gala RomneyBensimhon. Return precautions provided. Pt verbalized understanding and agreement with tx plan.   Discussed pt with Dr. Fonnie JarvisBednar who agrees with tx plan.     Junius FinnerErin O'Malley, PA-C 12/16/13 [phone]

## 2013-12-15 NOTE — ED Notes (Signed)
Introduced self to pt and family member.  Pt stated "I feel better now my blood sugar is back up.  I know how my blood sugar does, when it's low I sweat and feel weak/sick"   Pt skin wm and dry. A X O x 4.

## 2013-12-15 NOTE — Discharge Instructions (Signed)
Chest Pain (Nonspecific) °Chest pain has many causes. Your pain could be caused by something serious, such as a heart attack or a blood clot in the lungs. It could also be caused by something less serious, such as a chest bruise or a virus. Follow up with your doctor. More lab tests or other studies may be needed to find the cause of your pain. Most of the time, nonspecific chest pain will improve within 2 to 3 days of rest and mild pain medicine. °HOME CARE °· For chest bruises, you may put ice on the sore area for 15-20 minutes, 03-04 times a day. Do this only if it makes you feel better. °· Put ice in a plastic bag. °· Place a towel between the skin and the bag. °· Rest for the next 2 to 3 days. °· Go back to work if the pain improves. °· See your doctor if the pain lasts longer than 1 to 2 weeks. °· Only take medicine as told by your doctor. °· Quit smoking if you smoke. °GET HELP RIGHT AWAY IF:  °· There is more pain or pain that spreads to the arm, neck, jaw, back, or belly (abdomen). °· You have shortness of breath. °· You cough more than usual or cough up blood. °· You have very bad back or belly pain, feel sick to your stomach (nauseous), or throw up (vomit). °· You have very bad weakness. °· You pass out (faint). °· You have a fever. °Any of these problems may be serious and may be an emergency. Do not wait to see if the problems will go away. Get medical help right away. Call your local emergency services 911 in [place] Do not drive yourself to the hospital. °MAKE SURE YOU:  °· Understand these instructions. °· Will watch this condition. °· Will get help right away if you or your child is not doing well or gets worse. °Document Released: 01/07/2008 Document Revised: 10/13/2011 Document Reviewed: 01/07/2008 °[employer]® Patient Information ©2014 [employer]. ° °

## 2013-12-16 NOTE — ED Provider Notes (Signed)
Medical screening examination/treatment/procedure(s) were performed by non-physician practitioner and as supervising physician I was immediately available for consultation/collaboration.   EKG Interpretation None     Muse not working ECG: NSR, ventricular rate 83, septal Q waves, no definite acute ischemic changes noted, no comparison immediately available  Hurman HornJohn M Zhanae Proffit, MD 12/16/13 1359

## 2013-12-30 ENCOUNTER — Other Ambulatory Visit (HOSPITAL_COMMUNITY): Payer: Self-pay | Admitting: Cardiology

## 2013-12-30 DIAGNOSIS — I509 Heart failure, unspecified: Secondary | ICD-10-CM

## 2014-01-05 ENCOUNTER — Ambulatory Visit (HOSPITAL_COMMUNITY): Payer: Medicaid - Out of State

## 2014-01-05 ENCOUNTER — Encounter (HOSPITAL_COMMUNITY): Payer: Medicaid - Out of State

## 2015-09-02 IMAGING — CR DG CHEST 2V
2 series · 2 of 2 positions shown · non-contrast
Comparison: DG CHEST 2V dated 08/16/2013

CLINICAL DATA: CHEST PAIN

EXAM:
CHEST  2 VIEW

[w chest pa]
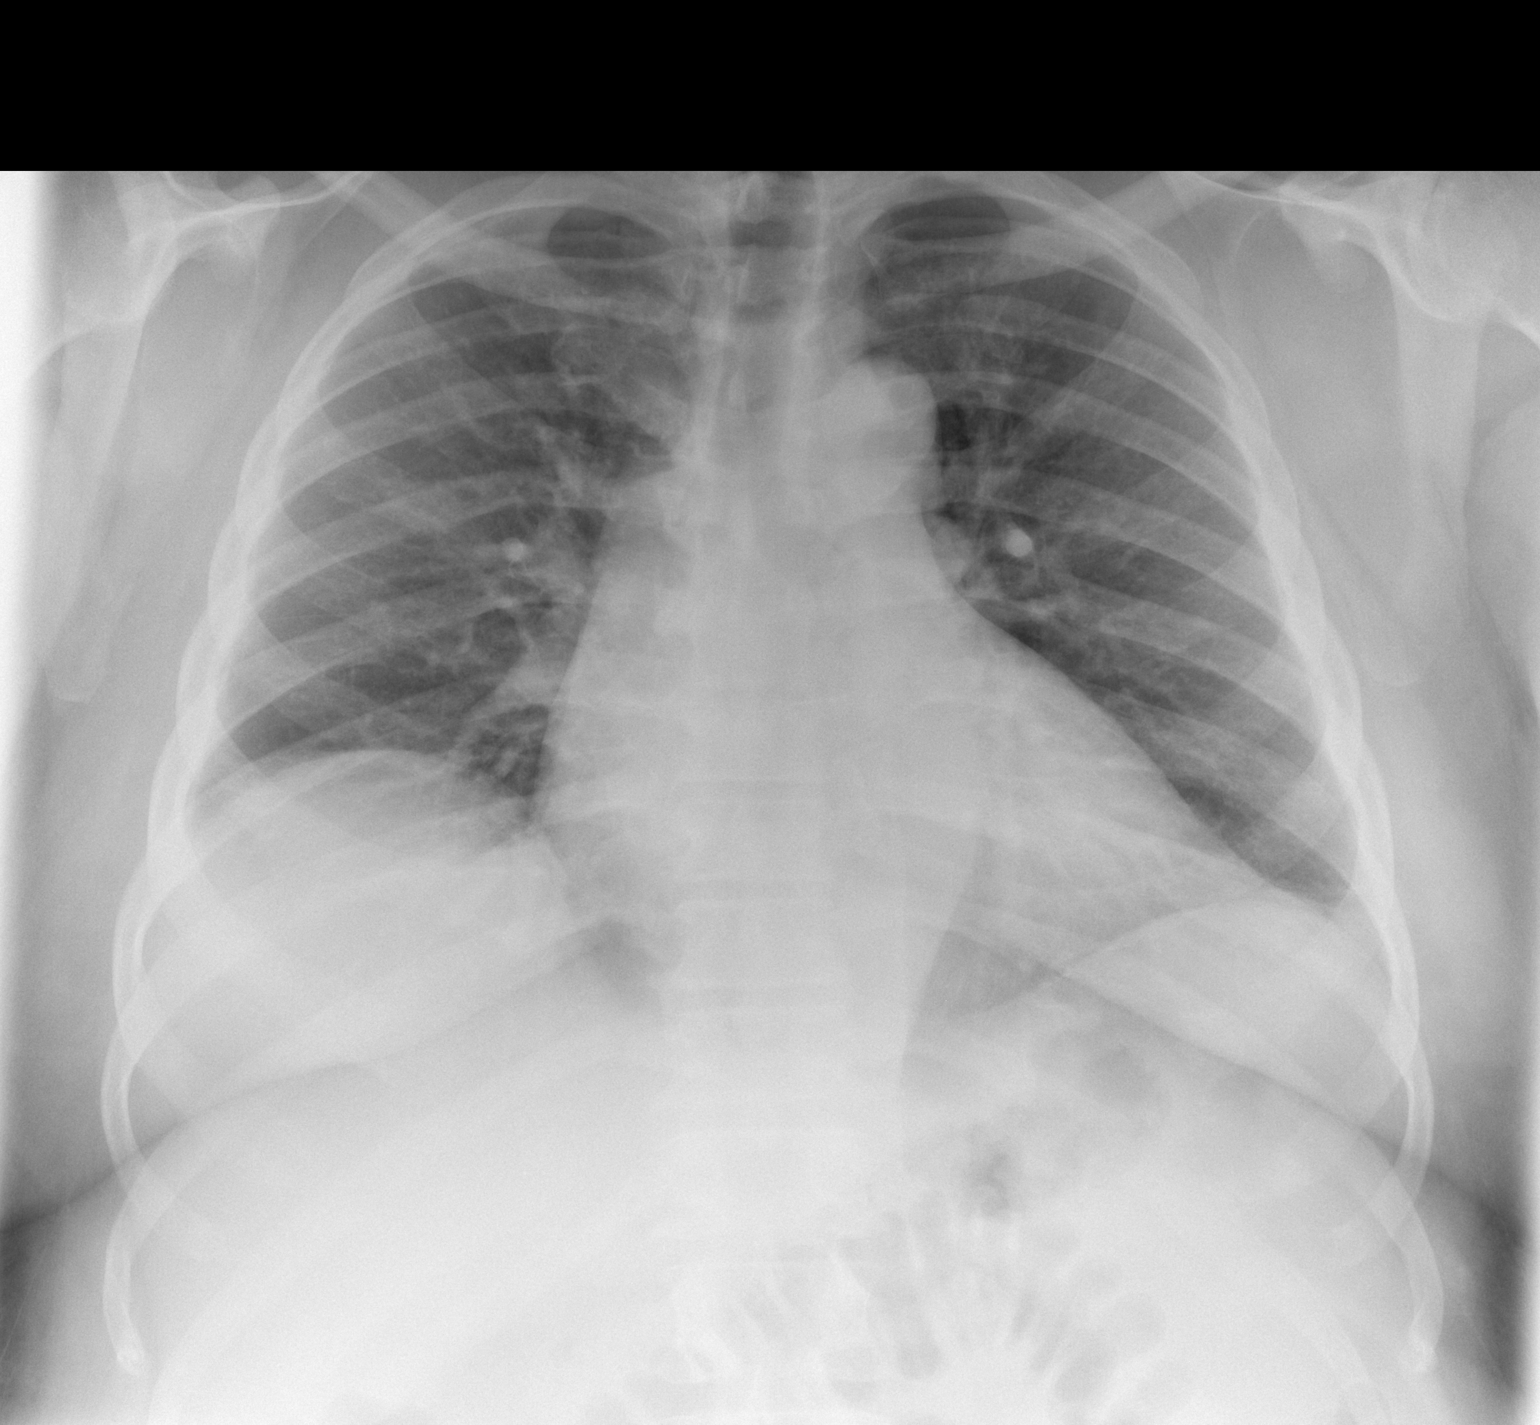

[w chest lat]
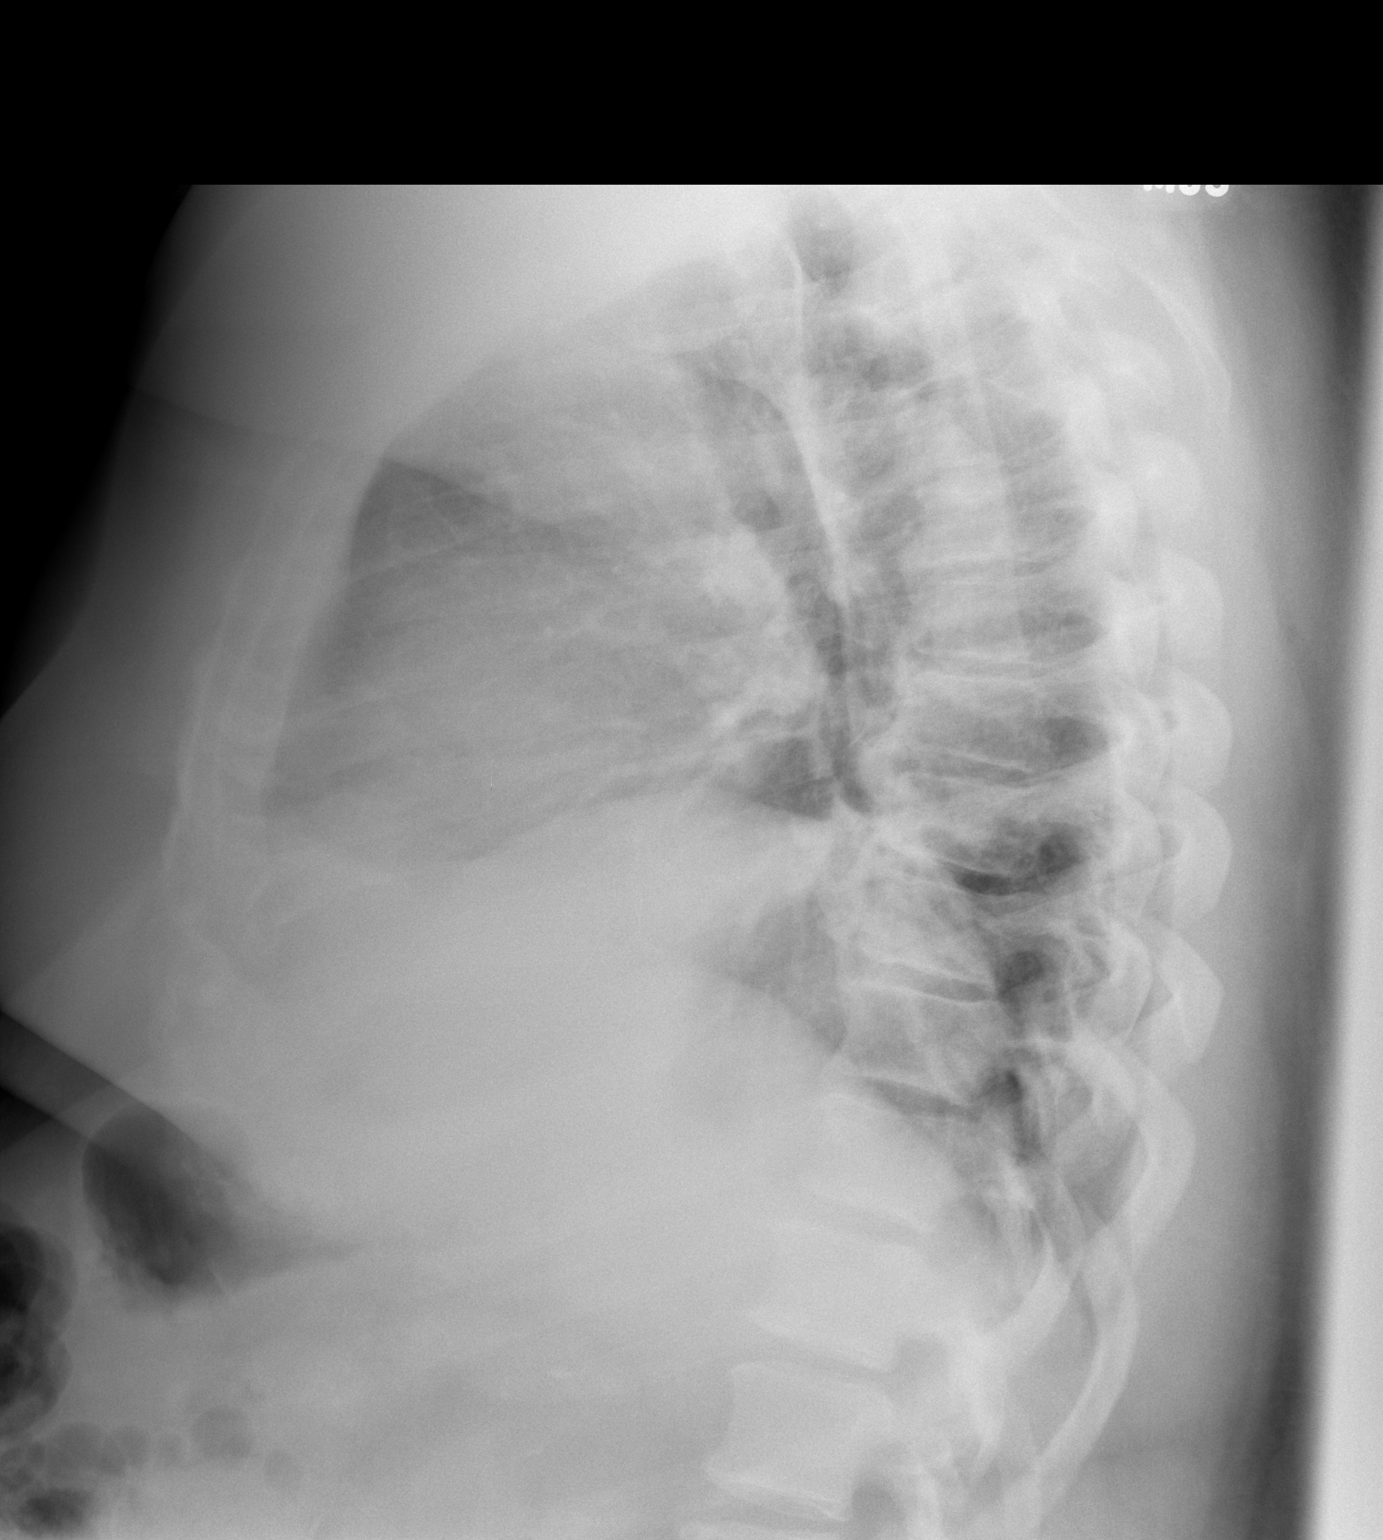

[2 of 2 positions shown; findings below may reference images not displayed]

FINDINGS: Low lung volumes. Cardiac silhouette is enlarged. The lungs are
clear. No acute osseous abnormalities.
IMPRESSION: No active cardiopulmonary disease.

## 2021-11-02 DEATH — deceased
